# Patient Record
Sex: Female | Born: 1965 | Race: White | Hispanic: No | Marital: Married | Smoking: Current every day smoker
Health system: Southern US, Community
[De-identification: ages and names within clinical notes are randomized; demographics above are authoritative.]

## PROBLEM LIST (undated history)

## (undated) DIAGNOSIS — G894 Chronic pain syndrome: Secondary | ICD-10-CM

## (undated) DIAGNOSIS — M549 Dorsalgia, unspecified: Secondary | ICD-10-CM

## (undated) DIAGNOSIS — K219 Gastro-esophageal reflux disease without esophagitis: Secondary | ICD-10-CM

## (undated) DIAGNOSIS — K589 Irritable bowel syndrome without diarrhea: Secondary | ICD-10-CM

## (undated) DIAGNOSIS — E119 Type 2 diabetes mellitus without complications: Secondary | ICD-10-CM

## (undated) DIAGNOSIS — F419 Anxiety disorder, unspecified: Secondary | ICD-10-CM

## (undated) DIAGNOSIS — F32A Depression, unspecified: Secondary | ICD-10-CM

## (undated) DIAGNOSIS — F329 Major depressive disorder, single episode, unspecified: Secondary | ICD-10-CM

## (undated) DIAGNOSIS — I499 Cardiac arrhythmia, unspecified: Secondary | ICD-10-CM

## (undated) DIAGNOSIS — J449 Chronic obstructive pulmonary disease, unspecified: Secondary | ICD-10-CM

## (undated) DIAGNOSIS — F17200 Nicotine dependence, unspecified, uncomplicated: Secondary | ICD-10-CM

## (undated) DIAGNOSIS — G43909 Migraine, unspecified, not intractable, without status migrainosus: Secondary | ICD-10-CM

## (undated) HISTORY — DX: Dorsalgia, unspecified: M54.9

## (undated) HISTORY — PX: BACK SURGERY: SHX140

## (undated) HISTORY — PX: CHOLECYSTECTOMY: SHX55

## (undated) HISTORY — DX: Gastro-esophageal reflux disease without esophagitis: K21.9

## (undated) HISTORY — DX: Migraine, unspecified, not intractable, without status migrainosus: G43.909

## (undated) HISTORY — DX: Major depressive disorder, single episode, unspecified: F32.9

## (undated) HISTORY — DX: Depression, unspecified: F32.A

---

## 1998-01-06 ENCOUNTER — Emergency Department (HOSPITAL_COMMUNITY): Admission: EM | Admit: 1998-01-06 | Discharge: 1998-01-06 | Payer: Self-pay | Admitting: Emergency Medicine

## 1998-02-11 HISTORY — PX: UTERINE FIBROID SURGERY: SHX826

## 2000-02-12 HISTORY — PX: LUMBAR DISC SURGERY: SHX700

## 2003-11-23 ENCOUNTER — Encounter: Admission: RE | Admit: 2003-11-23 | Discharge: 2003-11-23 | Payer: Self-pay | Admitting: *Deleted

## 2003-12-02 ENCOUNTER — Ambulatory Visit: Payer: Self-pay | Admitting: Family Medicine

## 2004-12-25 ENCOUNTER — Ambulatory Visit: Payer: Self-pay | Admitting: Family Medicine

## 2005-01-14 ENCOUNTER — Ambulatory Visit: Payer: Self-pay | Admitting: Family Medicine

## 2005-07-22 ENCOUNTER — Ambulatory Visit: Payer: Self-pay | Admitting: Family Medicine

## 2005-09-02 ENCOUNTER — Ambulatory Visit: Payer: Self-pay | Admitting: Family Medicine

## 2005-11-20 ENCOUNTER — Ambulatory Visit: Payer: Self-pay | Admitting: Family Medicine

## 2005-12-20 ENCOUNTER — Ambulatory Visit: Payer: Self-pay | Admitting: Urology

## 2005-12-26 ENCOUNTER — Ambulatory Visit: Payer: Self-pay

## 2006-03-05 ENCOUNTER — Ambulatory Visit: Payer: Self-pay | Admitting: Gastroenterology

## 2006-04-11 ENCOUNTER — Ambulatory Visit: Payer: Self-pay | Admitting: Gastroenterology

## 2006-10-23 ENCOUNTER — Ambulatory Visit: Payer: Self-pay

## 2006-12-09 ENCOUNTER — Ambulatory Visit: Payer: Self-pay | Admitting: Pain Medicine

## 2006-12-25 ENCOUNTER — Ambulatory Visit: Payer: Self-pay | Admitting: Family Medicine

## 2006-12-29 ENCOUNTER — Ambulatory Visit: Payer: Self-pay | Admitting: Pain Medicine

## 2007-02-02 ENCOUNTER — Ambulatory Visit: Payer: Self-pay | Admitting: Pain Medicine

## 2007-02-19 ENCOUNTER — Ambulatory Visit: Payer: Self-pay | Admitting: Pain Medicine

## 2007-03-04 ENCOUNTER — Ambulatory Visit: Payer: Self-pay | Admitting: Pain Medicine

## 2007-04-07 ENCOUNTER — Ambulatory Visit: Payer: Self-pay | Admitting: Pain Medicine

## 2007-04-27 ENCOUNTER — Ambulatory Visit: Payer: Self-pay | Admitting: Pain Medicine

## 2007-05-26 ENCOUNTER — Ambulatory Visit: Payer: Self-pay | Admitting: Pain Medicine

## 2007-06-22 ENCOUNTER — Ambulatory Visit: Payer: Self-pay | Admitting: Pain Medicine

## 2007-07-28 ENCOUNTER — Ambulatory Visit: Payer: Self-pay | Admitting: Pain Medicine

## 2007-08-03 ENCOUNTER — Ambulatory Visit: Payer: Self-pay | Admitting: Pain Medicine

## 2007-09-01 ENCOUNTER — Ambulatory Visit: Payer: Self-pay | Admitting: Pain Medicine

## 2007-09-07 ENCOUNTER — Ambulatory Visit: Payer: Self-pay | Admitting: Pain Medicine

## 2007-09-29 ENCOUNTER — Ambulatory Visit: Payer: Self-pay | Admitting: Pain Medicine

## 2007-10-12 ENCOUNTER — Ambulatory Visit: Payer: Self-pay | Admitting: Pain Medicine

## 2007-10-27 ENCOUNTER — Ambulatory Visit: Payer: Self-pay | Admitting: Pain Medicine

## 2007-11-05 IMAGING — MG UNKNOWN MG STUDY
1 series · 4 of 4 positions shown · non-contrast
Comparison: none

REASON FOR EXAM: screening mammo
COMMENTS:

[R CC · right · 4 of 4 slices shown]
[im 1/4]
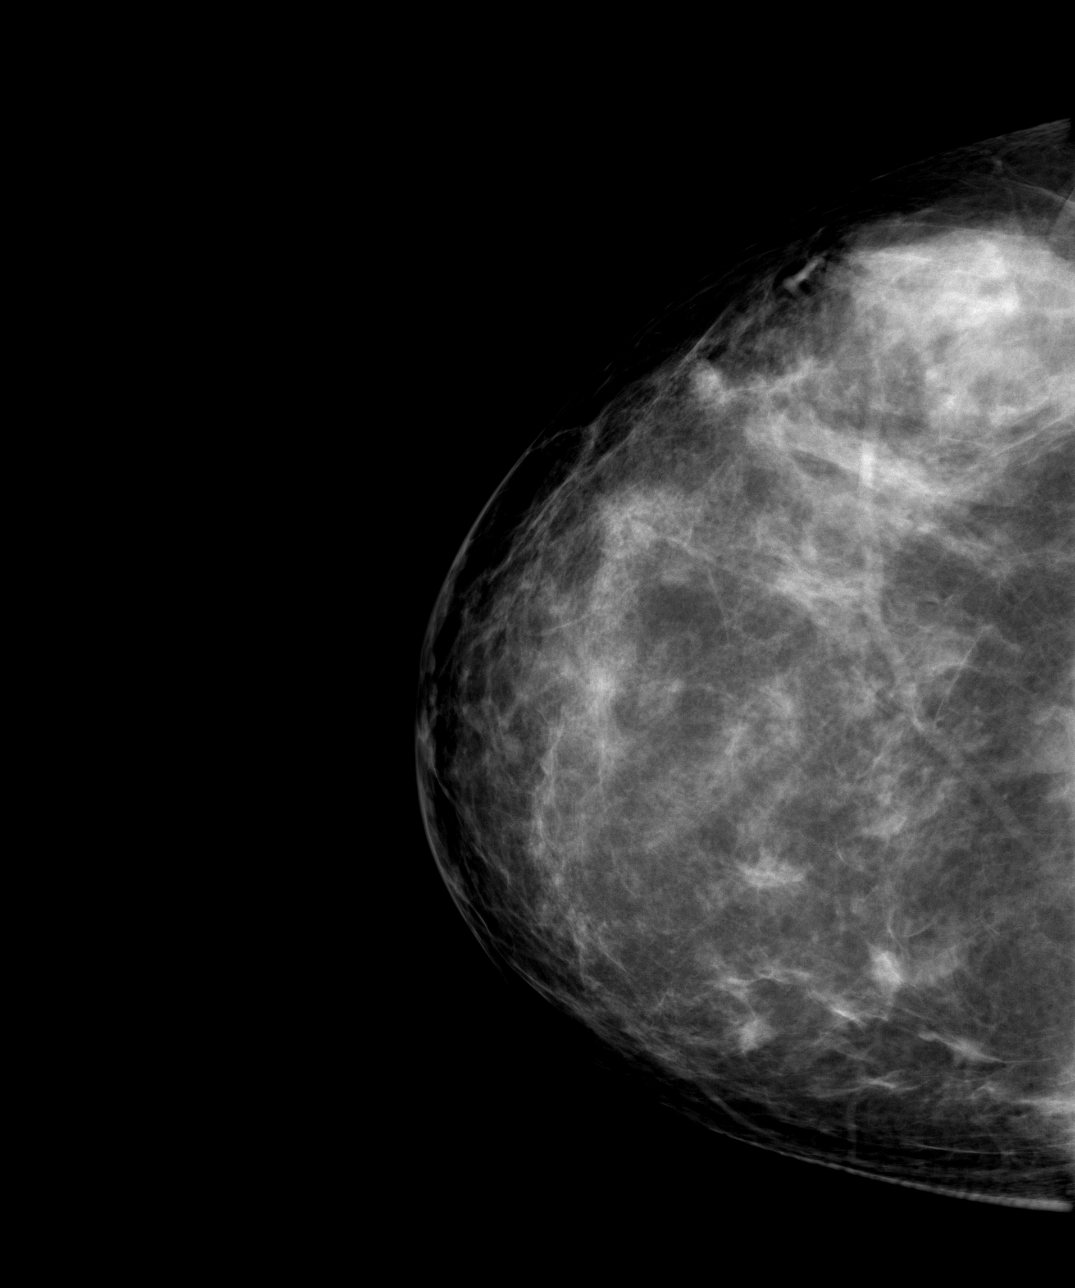
[im 2/4]
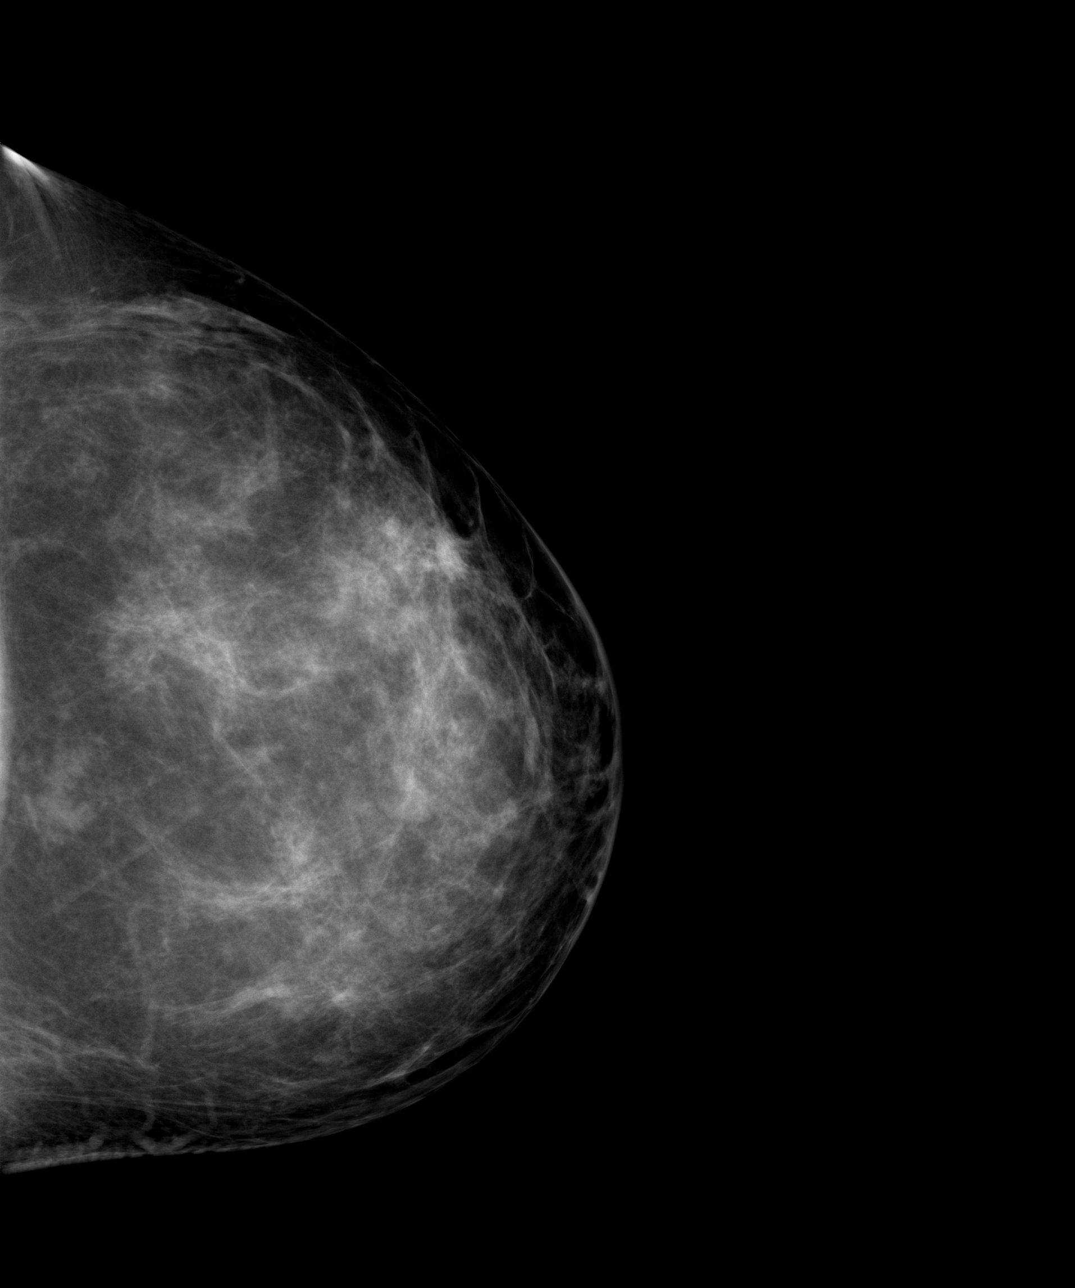
[im 3/4]
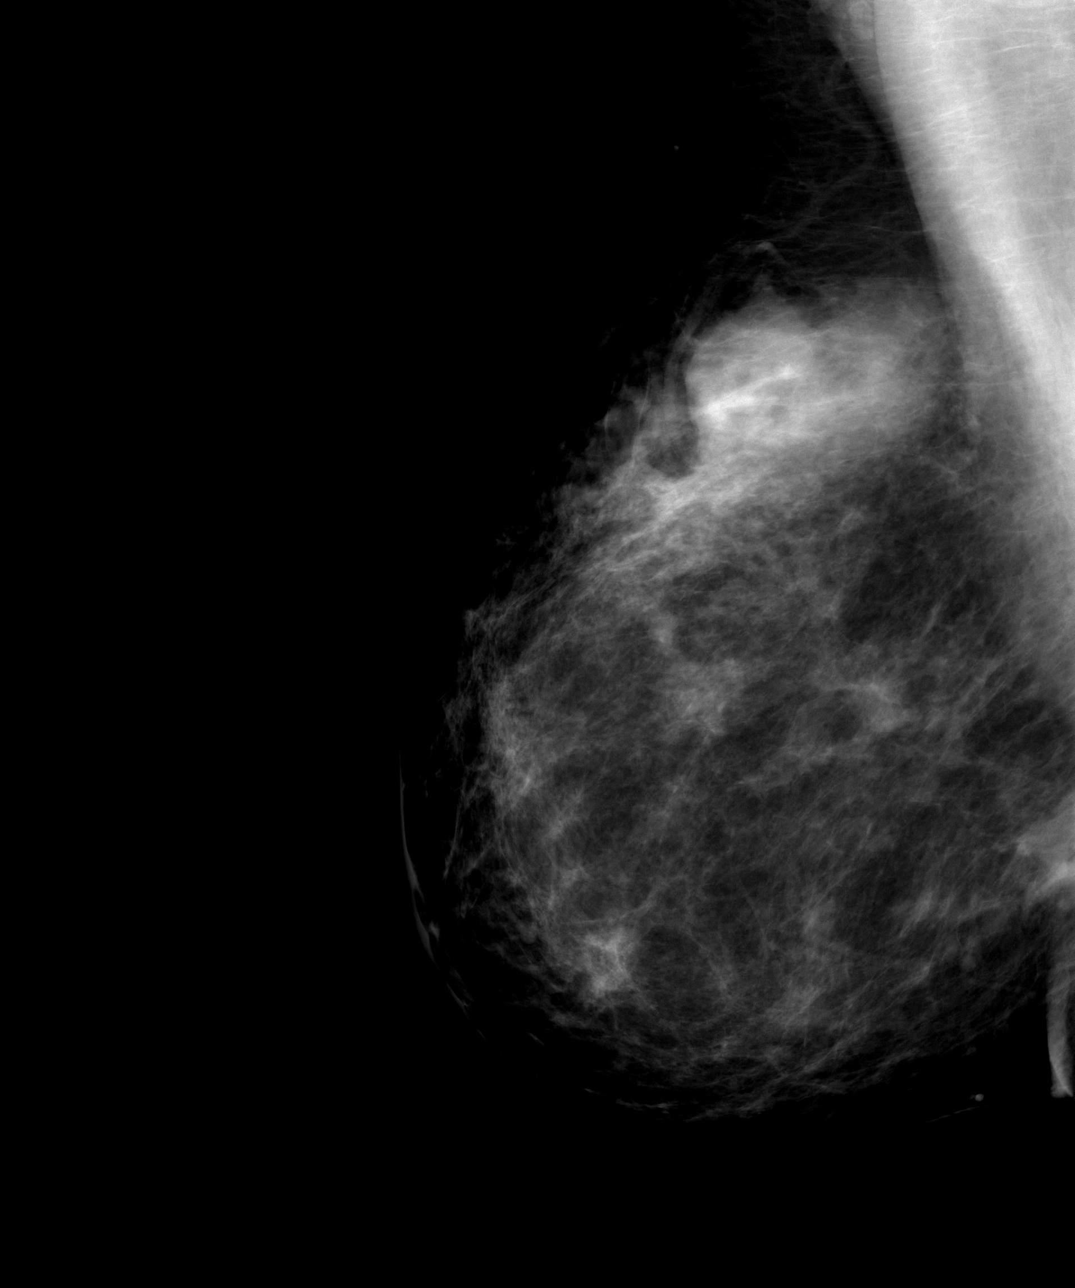
[im 4/4]
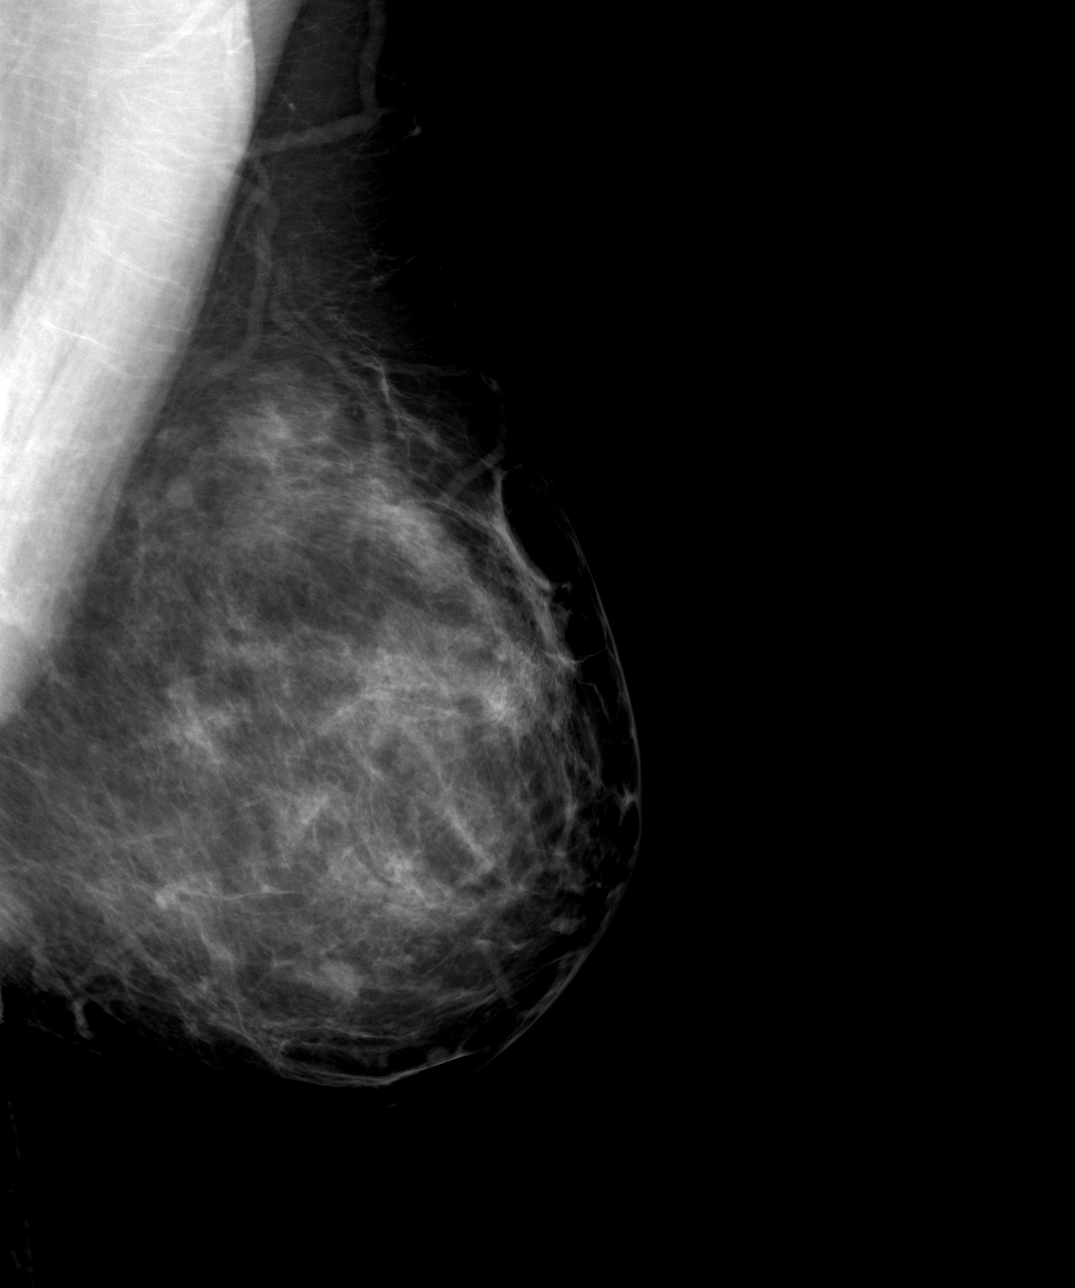

[4 of 4 positions shown; findings below may reference images not displayed]

PROCEDURE:     MAM - MAM DGTL SCREENING MAMMO W/CAD  - December 25, 2004  [DATE]

RESULT:     The patient has no prior exam for comparison.  The breasts
exhibit a moderately dense parenchymal pattern especially in the upper outer
quadrant of the RIGHT breast where there is parenchymal asymmetry.
Additional magnification compression views in this area would be
recommended.  Elsewhere the parenchymal pattern shows some mild nodularity
but no malignant-appearing lesions are evident.
IMPRESSION: Please have the patient return for additional magnification views of the
upper outer quadrant of the RIGHT breast.

BI-RADS: Category 0 - Needs additional imaging evaluation.

A NEGATIVE MAMMOGRAM REPORT DOES NOT PRECLUDE BIOPSY OR OTHER EVALUATION OF
A CLINICALLY PALPABLE OR OTHERWISE SUSPICIOUS MASS OR LESION.  BREAST CANCER
MAY NOT BE DETECTED BY MAMMOGRAPHY IN UP TO 10% OF CASES.

## 2007-11-16 ENCOUNTER — Ambulatory Visit: Payer: Self-pay | Admitting: Pain Medicine

## 2007-11-23 ENCOUNTER — Ambulatory Visit: Payer: Self-pay | Admitting: Pain Medicine

## 2007-11-30 ENCOUNTER — Ambulatory Visit: Payer: Self-pay | Admitting: Pain Medicine

## 2007-12-29 ENCOUNTER — Ambulatory Visit: Payer: Self-pay | Admitting: Pain Medicine

## 2008-01-04 ENCOUNTER — Ambulatory Visit: Payer: Self-pay | Admitting: Pain Medicine

## 2008-01-15 ENCOUNTER — Ambulatory Visit: Payer: Self-pay | Admitting: Unknown Physician Specialty

## 2008-01-26 ENCOUNTER — Ambulatory Visit: Payer: Self-pay | Admitting: Pain Medicine

## 2008-02-08 ENCOUNTER — Ambulatory Visit: Payer: Self-pay | Admitting: Pain Medicine

## 2008-03-03 ENCOUNTER — Ambulatory Visit: Payer: Self-pay | Admitting: Pain Medicine

## 2008-03-07 ENCOUNTER — Ambulatory Visit: Payer: Self-pay | Admitting: Pain Medicine

## 2008-03-17 ENCOUNTER — Ambulatory Visit: Payer: Self-pay | Admitting: Family Medicine

## 2008-03-29 ENCOUNTER — Ambulatory Visit: Payer: Self-pay | Admitting: Pain Medicine

## 2008-04-04 ENCOUNTER — Ambulatory Visit: Payer: Self-pay | Admitting: Unknown Physician Specialty

## 2008-04-04 ENCOUNTER — Ambulatory Visit: Payer: Self-pay | Admitting: Cardiology

## 2008-04-07 ENCOUNTER — Ambulatory Visit: Payer: Self-pay | Admitting: Unknown Physician Specialty

## 2008-04-27 ENCOUNTER — Ambulatory Visit: Payer: Self-pay | Admitting: Pain Medicine

## 2008-05-26 ENCOUNTER — Ambulatory Visit: Payer: Self-pay | Admitting: Pain Medicine

## 2008-06-01 ENCOUNTER — Ambulatory Visit: Payer: Self-pay | Admitting: Pain Medicine

## 2008-06-28 ENCOUNTER — Ambulatory Visit: Payer: Self-pay | Admitting: Pain Medicine

## 2008-07-06 ENCOUNTER — Ambulatory Visit: Payer: Self-pay | Admitting: Pain Medicine

## 2008-08-02 ENCOUNTER — Ambulatory Visit: Payer: Self-pay | Admitting: Pain Medicine

## 2008-08-02 ENCOUNTER — Ambulatory Visit: Payer: Self-pay | Admitting: Unknown Physician Specialty

## 2008-08-30 ENCOUNTER — Ambulatory Visit: Payer: Self-pay | Admitting: Pain Medicine

## 2008-09-29 ENCOUNTER — Ambulatory Visit: Payer: Self-pay | Admitting: Pain Medicine

## 2008-10-31 ENCOUNTER — Ambulatory Visit: Payer: Self-pay | Admitting: Pain Medicine

## 2008-11-29 ENCOUNTER — Ambulatory Visit: Payer: Self-pay | Admitting: Pain Medicine

## 2008-12-16 ENCOUNTER — Ambulatory Visit: Payer: Self-pay | Admitting: Pain Medicine

## 2009-01-17 ENCOUNTER — Ambulatory Visit: Payer: Self-pay | Admitting: Family Medicine

## 2009-01-24 ENCOUNTER — Ambulatory Visit: Payer: Self-pay | Admitting: Pain Medicine

## 2009-02-01 ENCOUNTER — Ambulatory Visit: Payer: Self-pay | Admitting: Pain Medicine

## 2009-02-23 ENCOUNTER — Ambulatory Visit: Payer: Self-pay | Admitting: Pain Medicine

## 2009-03-01 ENCOUNTER — Ambulatory Visit: Payer: Self-pay | Admitting: Pain Medicine

## 2009-03-23 ENCOUNTER — Ambulatory Visit: Payer: Self-pay | Admitting: Pain Medicine

## 2009-03-29 ENCOUNTER — Ambulatory Visit: Payer: Self-pay | Admitting: Pain Medicine

## 2009-05-03 ENCOUNTER — Ambulatory Visit: Payer: Self-pay | Admitting: Pain Medicine

## 2009-05-30 ENCOUNTER — Ambulatory Visit: Payer: Self-pay | Admitting: Pain Medicine

## 2009-06-08 ENCOUNTER — Ambulatory Visit: Payer: Self-pay | Admitting: Gastroenterology

## 2009-06-29 ENCOUNTER — Ambulatory Visit: Payer: Self-pay | Admitting: Pain Medicine

## 2009-07-05 ENCOUNTER — Ambulatory Visit: Payer: Self-pay | Admitting: Pain Medicine

## 2009-08-01 ENCOUNTER — Ambulatory Visit: Payer: Self-pay | Admitting: Pain Medicine

## 2009-08-16 ENCOUNTER — Ambulatory Visit: Payer: Self-pay | Admitting: Pain Medicine

## 2009-08-29 ENCOUNTER — Ambulatory Visit: Payer: Self-pay | Admitting: Pain Medicine

## 2009-10-03 ENCOUNTER — Ambulatory Visit: Payer: Self-pay | Admitting: Pain Medicine

## 2009-10-23 ENCOUNTER — Ambulatory Visit: Payer: Self-pay | Admitting: Pain Medicine

## 2009-11-23 ENCOUNTER — Ambulatory Visit: Payer: Self-pay | Admitting: Pain Medicine

## 2009-11-27 ENCOUNTER — Ambulatory Visit: Payer: Self-pay | Admitting: Pain Medicine

## 2009-12-28 ENCOUNTER — Ambulatory Visit: Payer: Self-pay | Admitting: Pain Medicine

## 2010-01-25 ENCOUNTER — Ambulatory Visit: Payer: Self-pay | Admitting: Pain Medicine

## 2010-02-22 ENCOUNTER — Ambulatory Visit: Payer: Self-pay | Admitting: Pain Medicine

## 2010-03-09 ENCOUNTER — Ambulatory Visit: Payer: Self-pay | Admitting: Pain Medicine

## 2010-03-12 ENCOUNTER — Ambulatory Visit: Payer: Self-pay | Admitting: Pain Medicine

## 2010-03-12 ENCOUNTER — Emergency Department: Payer: Self-pay | Admitting: Emergency Medicine

## 2010-03-20 ENCOUNTER — Ambulatory Visit: Payer: Self-pay | Admitting: Family Medicine

## 2010-05-15 ENCOUNTER — Ambulatory Visit: Payer: Self-pay | Admitting: Pain Medicine

## 2010-05-28 ENCOUNTER — Ambulatory Visit: Payer: Self-pay | Admitting: Pain Medicine

## 2010-06-12 ENCOUNTER — Ambulatory Visit: Payer: Self-pay | Admitting: Pain Medicine

## 2010-06-20 ENCOUNTER — Ambulatory Visit: Payer: Self-pay | Admitting: Pain Medicine

## 2010-07-10 ENCOUNTER — Ambulatory Visit: Payer: Self-pay | Admitting: Pain Medicine

## 2010-08-09 ENCOUNTER — Ambulatory Visit: Payer: Self-pay | Admitting: Pain Medicine

## 2010-09-27 ENCOUNTER — Ambulatory Visit: Payer: Self-pay | Admitting: Pain Medicine

## 2010-10-25 ENCOUNTER — Ambulatory Visit: Payer: Self-pay | Admitting: Pain Medicine

## 2010-11-07 ENCOUNTER — Ambulatory Visit: Payer: Self-pay | Admitting: Pain Medicine

## 2010-11-20 ENCOUNTER — Ambulatory Visit: Payer: Self-pay | Admitting: Pain Medicine

## 2010-12-05 ENCOUNTER — Ambulatory Visit: Payer: Self-pay | Admitting: Pain Medicine

## 2010-12-20 ENCOUNTER — Ambulatory Visit: Payer: Self-pay | Admitting: Pain Medicine

## 2011-01-07 ENCOUNTER — Ambulatory Visit: Payer: Self-pay | Admitting: Pain Medicine

## 2011-01-22 ENCOUNTER — Ambulatory Visit: Payer: Self-pay | Admitting: Pain Medicine

## 2011-02-21 ENCOUNTER — Ambulatory Visit: Payer: Self-pay | Admitting: Pain Medicine

## 2011-03-28 ENCOUNTER — Ambulatory Visit: Payer: Self-pay | Admitting: Pain Medicine

## 2011-04-29 ENCOUNTER — Ambulatory Visit: Payer: Self-pay | Admitting: Pain Medicine

## 2011-05-23 ENCOUNTER — Ambulatory Visit: Payer: Self-pay | Admitting: Family Medicine

## 2011-05-28 ENCOUNTER — Ambulatory Visit: Payer: Self-pay | Admitting: Pain Medicine

## 2011-06-25 ENCOUNTER — Ambulatory Visit: Payer: Self-pay | Admitting: Pain Medicine

## 2011-07-10 ENCOUNTER — Ambulatory Visit: Payer: Self-pay | Admitting: Pain Medicine

## 2011-07-25 ENCOUNTER — Ambulatory Visit: Payer: Self-pay | Admitting: Pain Medicine

## 2011-08-27 ENCOUNTER — Ambulatory Visit: Payer: Self-pay | Admitting: Pain Medicine

## 2011-09-24 ENCOUNTER — Ambulatory Visit: Payer: Self-pay | Admitting: Pain Medicine

## 2011-09-30 ENCOUNTER — Ambulatory Visit: Payer: Self-pay | Admitting: Pain Medicine

## 2011-10-24 ENCOUNTER — Ambulatory Visit: Payer: Self-pay | Admitting: Pain Medicine

## 2011-11-26 ENCOUNTER — Ambulatory Visit: Payer: Self-pay | Admitting: Pain Medicine

## 2011-12-31 ENCOUNTER — Ambulatory Visit: Payer: Self-pay | Admitting: Pain Medicine

## 2012-01-07 ENCOUNTER — Ambulatory Visit: Payer: Self-pay | Admitting: Obstetrics and Gynecology

## 2012-01-07 LAB — CBC
HCT: 39.1 % (ref 35.0–47.0)
HGB: 13.5 g/dL (ref 12.0–16.0)
MCH: 31.6 pg (ref 26.0–34.0)
RBC: 4.26 10*6/uL (ref 3.80–5.20)
WBC: 7.3 10*3/uL (ref 3.6–11.0)

## 2012-01-07 LAB — COMPREHENSIVE METABOLIC PANEL
Albumin: 3.6 g/dL (ref 3.4–5.0)
BUN: 5 mg/dL — ABNORMAL LOW (ref 7–18)
Creatinine: 0.88 mg/dL (ref 0.60–1.30)
EGFR (African American): >60
EGFR (Non-African Amer.): >60
SGOT(AST): 22 U/L (ref 15–37)
SGPT (ALT): 27 U/L (ref 12–78)
Sodium: 139 mmol/L (ref 136–145)
Total Protein: 7.3 g/dL (ref 6.4–8.2)

## 2012-01-16 ENCOUNTER — Ambulatory Visit: Payer: Self-pay | Admitting: Obstetrics and Gynecology

## 2012-01-20 LAB — PATHOLOGY REPORT

## 2012-01-30 ENCOUNTER — Ambulatory Visit: Payer: Self-pay | Admitting: Pain Medicine

## 2012-02-27 ENCOUNTER — Ambulatory Visit: Payer: Self-pay | Admitting: Pain Medicine

## 2012-03-25 ENCOUNTER — Ambulatory Visit: Payer: Self-pay | Admitting: Pain Medicine

## 2012-04-23 ENCOUNTER — Ambulatory Visit: Payer: Self-pay | Admitting: Pain Medicine

## 2012-05-25 ENCOUNTER — Ambulatory Visit: Payer: Self-pay | Admitting: Family Medicine

## 2012-05-28 ENCOUNTER — Ambulatory Visit: Payer: Self-pay | Admitting: Pain Medicine

## 2012-06-06 ENCOUNTER — Ambulatory Visit: Payer: Self-pay | Admitting: Pain Medicine

## 2012-06-29 ENCOUNTER — Ambulatory Visit: Payer: Self-pay | Admitting: Pain Medicine

## 2012-07-10 ENCOUNTER — Ambulatory Visit: Payer: Self-pay | Admitting: Pain Medicine

## 2012-07-28 ENCOUNTER — Ambulatory Visit: Payer: Self-pay | Admitting: Pain Medicine

## 2012-08-26 ENCOUNTER — Ambulatory Visit: Payer: Self-pay | Admitting: Pain Medicine

## 2012-09-24 ENCOUNTER — Ambulatory Visit: Payer: Self-pay | Admitting: Pain Medicine

## 2012-10-22 ENCOUNTER — Ambulatory Visit: Payer: Self-pay | Admitting: Pain Medicine

## 2012-11-18 ENCOUNTER — Ambulatory Visit: Payer: Self-pay | Admitting: Pain Medicine

## 2012-12-23 ENCOUNTER — Ambulatory Visit: Payer: Self-pay | Admitting: Pain Medicine

## 2013-01-21 ENCOUNTER — Ambulatory Visit: Payer: Self-pay | Admitting: Pain Medicine

## 2013-02-17 ENCOUNTER — Ambulatory Visit: Payer: Self-pay | Admitting: Pain Medicine

## 2013-03-15 ENCOUNTER — Ambulatory Visit: Payer: Self-pay | Admitting: Pain Medicine

## 2013-04-22 ENCOUNTER — Ambulatory Visit: Payer: Self-pay | Admitting: Pain Medicine

## 2013-04-25 ENCOUNTER — Emergency Department: Payer: Self-pay | Admitting: Emergency Medicine

## 2013-04-25 LAB — BASIC METABOLIC PANEL
Anion Gap: 5 — ABNORMAL LOW (ref 7–16)
BUN: 4 mg/dL — ABNORMAL LOW (ref 7–18)
Calcium, Total: 8.5 mg/dL (ref 8.5–10.1)
Chloride: 111 mmol/L — ABNORMAL HIGH (ref 98–107)
Co2: 26 mmol/L (ref 21–32)
Creatinine: 0.91 mg/dL (ref 0.60–1.30)
EGFR (African American): >60
EGFR (Non-African Amer.): >60
Glucose: 106 mg/dL — ABNORMAL HIGH (ref 65–99)
OSMOLALITY: 280 (ref 275–301)
Potassium: 3.9 mmol/L (ref 3.5–5.1)
Sodium: 142 mmol/L (ref 136–145)

## 2013-04-25 LAB — CBC
HCT: 42 % (ref 35.0–47.0)
HGB: 14 g/dL (ref 12.0–16.0)
MCH: 31 pg (ref 26.0–34.0)
MCHC: 33.3 g/dL (ref 32.0–36.0)
MCV: 93 fL (ref 80–100)
Platelet: 338 10*3/uL (ref 150–440)
RBC: 4.52 10*6/uL (ref 3.80–5.20)
RDW: 14 % (ref 11.5–14.5)
WBC: 12.8 10*3/uL — AB (ref 3.6–11.0)

## 2013-04-25 LAB — TROPONIN I: Troponin-I: <0.02 ng/mL

## 2013-05-20 ENCOUNTER — Ambulatory Visit: Payer: Self-pay | Admitting: Pain Medicine

## 2013-06-17 ENCOUNTER — Ambulatory Visit: Payer: Self-pay | Admitting: Pain Medicine

## 2013-07-28 ENCOUNTER — Ambulatory Visit: Payer: Self-pay | Admitting: Pain Medicine

## 2013-08-24 ENCOUNTER — Ambulatory Visit: Payer: Self-pay | Admitting: Pain Medicine

## 2013-10-20 ENCOUNTER — Ambulatory Visit: Payer: Self-pay | Admitting: Pain Medicine

## 2013-11-18 ENCOUNTER — Ambulatory Visit: Payer: Self-pay | Admitting: Pain Medicine

## 2013-12-16 ENCOUNTER — Ambulatory Visit: Payer: Self-pay | Admitting: Pain Medicine

## 2013-12-20 ENCOUNTER — Ambulatory Visit: Payer: Self-pay | Admitting: Pain Medicine

## 2013-12-28 ENCOUNTER — Ambulatory Visit: Payer: Self-pay | Admitting: Internal Medicine

## 2014-01-18 ENCOUNTER — Ambulatory Visit: Payer: Self-pay | Admitting: Pain Medicine

## 2014-02-17 ENCOUNTER — Ambulatory Visit: Payer: Self-pay | Admitting: Pain Medicine

## 2014-03-21 ENCOUNTER — Ambulatory Visit: Payer: Self-pay | Admitting: Pain Medicine

## 2014-04-18 ENCOUNTER — Ambulatory Visit: Payer: Self-pay | Admitting: Pain Medicine

## 2014-05-18 ENCOUNTER — Ambulatory Visit
Admit: 2014-05-18 | Disposition: A | Discharge status: Home / Self Care | Payer: Self-pay | Attending: Pain Medicine | Admitting: Pain Medicine

## 2014-06-03 NOTE — Op Note (Signed)
PATIENT NAME:  Meagan Bridges, Meagan Bridges MR#:  [id number] DATE OF BIRTH:  1965/07/10  DATE OF PROCEDURE:  01/16/2012  PREOPERATIVE DIAGNOSIS:  Abnormal uterine bleeding.   POSTOPERATIVE DIAGNOSIS: Abnormal uterine bleeding.   PROCEDURES:    1. Dilatation and curettage.  2. Hysteroscopy.  3. NovaSure endometrial ablation.   SURGEON:  Prentice Docker, MD.  ANESTHESIA:  General.   ESTIMATED BLOOD LOSS: Minimal.   COMPLICATIONS: None.   IV FLUIDS: 500 milliliters crystalloid.   FINDINGS:  1. Normal appearing uterine cavity.  2. Appearance of ablation globally in uterus post procedure.  SPECIMENS: Endometrial curettings.   CONDITION AT THE END OF THE PROCEDURE:  Stable.   PROCEDURE IN DETAIL:  The patient was met in the preoperative area and her questions were answered. She was taken to the Operating Room and placed under general anesthesia. Anesthesia was found to be adequate and she was then placed in high lithotomy using the candy-cane stirrups and prepped and draped in the usual sterile fashion. After a time-out was called, a straight catheter was used to empty her bladder.   A sterile speculum was placed in the vagina and a single tooth tenaculum was used to grasp the anterior lip of the cervix. The uterus was sounded to 8 cm. The cervix was then serially dilated using Hegar's dilators to 8 millimeters.   The hysteroscope was then gently introduced through the cervix with the above-noted findings. The hysteroscope was then removed and the curettage was then undertaken.    The endometrial ablation portion of the surgery was undertaken with the cavity length being 4 cm and the cavity width being 3.8 cm. Cavity test was completed and found to be appropriate and the ablation commenced without difficulty. At the end of the procedure the NovaSure device was removed and the hysteroscope was reintroduced through the cervix with globally ablated endometrial lining noted. The hysteroscope was then  removed and the tenaculum was removed from the anterior lip of the cervix with good hemostasis noted. The speculum was then removed.   The patient tolerated the procedure well. Sponge, lap and needle counts were correct x2. The patient was awakened in the Operating Room and taken to the Recovery Room in stable condition.    ____________________________ Will Bonnet, MD sdj:ap D: 01/16/2012 15:58:46 ET T: 01/16/2012 17:37:29 ET JOB#: [id number]  cc: Will Bonnet, MD, <Dictator> Will Bonnet MD ELECTRONICALLY SIGNED 02/26/2012 7:06

## 2014-06-20 ENCOUNTER — Ambulatory Visit: Payer: Self-pay | Admitting: Pain Medicine

## 2014-06-22 ENCOUNTER — Ambulatory Visit: Payer: Self-pay | Admitting: Pain Medicine

## 2014-06-22 ENCOUNTER — Other Ambulatory Visit: Payer: Self-pay | Admitting: Pain Medicine

## 2014-06-22 ENCOUNTER — Encounter: Payer: Self-pay | Admitting: Pain Medicine

## 2014-06-22 ENCOUNTER — Ambulatory Visit: Payer: BLUE CROSS/BLUE SHIELD | Attending: Pain Medicine | Admitting: Pain Medicine

## 2014-06-22 VITALS — BP 119/62 | HR 85 | Temp 97.5°F | Resp 16 | Ht 63.0 in | Wt 170.0 lb

## 2014-06-22 DIAGNOSIS — M461 Sacroiliitis, not elsewhere classified: Secondary | ICD-10-CM

## 2014-06-22 DIAGNOSIS — M5136 Other intervertebral disc degeneration, lumbar region: Secondary | ICD-10-CM

## 2014-06-22 DIAGNOSIS — M419 Scoliosis, unspecified: Secondary | ICD-10-CM | POA: Diagnosis not present

## 2014-06-22 DIAGNOSIS — M47816 Spondylosis without myelopathy or radiculopathy, lumbar region: Secondary | ICD-10-CM

## 2014-06-22 DIAGNOSIS — M5416 Radiculopathy, lumbar region: Secondary | ICD-10-CM

## 2014-06-22 DIAGNOSIS — M4806 Spinal stenosis, lumbar region: Secondary | ICD-10-CM | POA: Insufficient documentation

## 2014-06-22 DIAGNOSIS — M8580 Other specified disorders of bone density and structure, unspecified site: Secondary | ICD-10-CM | POA: Insufficient documentation

## 2014-06-22 DIAGNOSIS — M5126 Other intervertebral disc displacement, lumbar region: Secondary | ICD-10-CM | POA: Diagnosis not present

## 2014-06-22 DIAGNOSIS — M545 Low back pain: Secondary | ICD-10-CM | POA: Diagnosis present

## 2014-06-22 DIAGNOSIS — M51369 Other intervertebral disc degeneration, lumbar region without mention of lumbar back pain or lower extremity pain: Secondary | ICD-10-CM

## 2014-06-22 DIAGNOSIS — M47818 Spondylosis without myelopathy or radiculopathy, sacral and sacrococcygeal region: Secondary | ICD-10-CM

## 2014-06-22 MED ORDER — FENTANYL CITRATE (PF) 100 MCG/2ML IJ SOLN
INTRAMUSCULAR | Status: AC
  Administered 2014-06-22: 100 ug via INTRAVENOUS
  Filled 2014-06-22: qty 2

## 2014-06-22 MED ORDER — TRIAMCINOLONE ACETONIDE 40 MG/ML IJ SUSP
INTRAMUSCULAR | Status: AC
  Administered 2014-06-22: 40 mg
  Filled 2014-06-22: qty 1

## 2014-06-22 MED ORDER — HYDROCODONE-ACETAMINOPHEN 5-325 MG PO TABS: ORAL_TABLET | ORAL | Status: DC

## 2014-06-22 MED ORDER — MIDAZOLAM HCL 5 MG/5ML IJ SOLN
INTRAMUSCULAR | Status: AC
  Administered 2014-06-22: 3 mg via INTRAVENOUS
  Filled 2014-06-22: qty 5

## 2014-06-22 MED ORDER — CYCLOBENZAPRINE HCL 10 MG PO TABS: 10.0000 mg | ORAL_TABLET | Freq: Three times a day (TID) | ORAL | Status: DC | PRN

## 2014-06-22 MED ORDER — ORPHENADRINE CITRATE 30 MG/ML IJ SOLN
INTRAMUSCULAR | Status: AC
  Administered 2014-06-22: 30 mg
  Filled 2014-06-22: qty 2

## 2014-06-22 MED ORDER — BUPIVACAINE HCL (PF) 0.25 % IJ SOLN
INTRAMUSCULAR | Status: AC
  Administered 2014-06-22: 20 mL
  Filled 2014-06-22: qty 30

## 2014-06-22 NOTE — Progress Notes (Signed)
Discharged via w/c at 1215 am. Tolerating po fluids well. Instructions (verbal and written) given to patient. Teachback x3.

## 2014-06-22 NOTE — Patient Instructions (Addendum)
Continue present medications and antibioticcs (Script for [employer] given today and Flex[facility]il refill sent [facility].)  F/U PCP for evaliation of  BP and general medical  Condition.  F/U surgical evaluation.  F/U nrurological evaluation.  May consider radiofrequency rhizolysis or intraspinal procedures pending response to present treatment and F/U evaluation.  Patient to call Pain Management Center should patient have concerns prior to scheduled return appointment.    Pain Management Discharge Instructions  General Discharge Instructions :  If you need to reach your doctor call: Monday-Friday 8:00 am - 4:00 pm at [phone] or toll free [phone].  After clinic hours [phone] to have operator reach doctor.  Bring all of your medication bottles to all your appointments in the pain clinic.  To cancel or reschedule your appointment with Pain Management please remember to call 24 hours in advance to avoid a fee.  Refer to the educational materials which you have been given on: General Risks, I had my Procedure. Discharge Instructions, Post Sedation.  Post Procedure Instructions:  The drugs you were given will stay in your system until tomorrow, so for the next 24 hours you should not drive, make any legal decisions or drink any alcoholic beverages.  You may eat anything you prefer, but it is better to start with liquids then soups and crackers, and gradually work up to solid foods.  Please notify your doctor immediately if you have any unusual bleeding, trouble breathing or pain that is not related to your normal pain.  Depending on the type of procedure that was done, some parts of your body may feel week and/or numb.  This usually clears up by tonight or the next day.  Walk with the use of an assistive device or accompanied by an adult for the 24 hours.  You may use ice on the affected area for the first 24 hours.  Put ice in a Ziploc bag and cover with a towel and place  against area 15 minutes on 15 minutes off.  You may switch to heat after 24 hours.

## 2014-06-22 NOTE — Progress Notes (Signed)
Patient is 49 year old female returns to pain management Center for further evaluation and treatment of pain involving the lumbar lower extremity region with severe pain rated best standing walking and twisting and turning patient was severe tenderness to palpation over the PSIS and PIIS regions with positive Patrick's maneuver is well. There is concern regarding intraspinal abnormalities contributing to patient's symptomatology as well as significant sacroiliac joint dysfunction. Prior MRIs revealed patient to be with L2-3 and L3-4 disc bulge, L3-4 facet arthropathy, severe spinal canal stenosis, L4-5 disc bulging and multilevel facet arthropathy arthropathy, osteopenia and scoliosis. We will proceed with sacroiliac joint injection in attempt to decrease severity of patient's symptoms minimize aggression of patient's symptoms and hopefully avoid need for more involved treatment. The patient was understanding and in agreement with suggested treatment plan.  Description of procedure sacroiliac joint injection on the left side and right side  Left sacroiliac joint injection  Patient was taken to fluoroscopy suite with EKG blood pressure pulse and pulse oximetry monitoring in place. Patient in prone position. Patient was administered IV Versed and IV fentanyl for moderate conscious sedation. Betadine prep of proposed entry site was accomplished under fluoroscopic guidance a 22-gauge needle was inserted at the inferior pole of the sacroiliac joint on the left. Following documentation of needle placement at the inferior pole of the sacroiliac joint on the left, a total of 1 cc of quarter percent bupivacaine with Kenalog was injected for left inferior pole sacroiliac joint injection. Needle placement was then accomplished at the left superior pole  The patient in prone position and Betadine prep of proposed entry site accomplished a 22-gauge needle was inserted at the superior pole of the sacroiliac joint on the  left under fluoroscopic guidance. Following documentation of needle placement at the superior pole of the sacroiliac joint on the left, a total of 1 cc of quarter percent bupivacaine with Kenalog was injected for right superior pole sacroiliac joint injection. Needle was removed  Right sacroiliac joint injection  Repeat the technique on the right is performed on the left with injection of the inferior and superior poles of the sacroiliac joint on the right. A total of 10 mg of Kenalog was utilized for the procedure    Plan   Continue present medications.  F/U PCP for evaliation of  BP and general medical  condition.  F/U surgical evaluation.  F/U nrurological evaluation.  May consider radiofrequency rhizolysis or intraspinal procedures pending response to present treatment and F/U evaluation.  Patient to call Pain Management Center should patient have concerns prior to scheduled return appointment.

## 2014-06-23 ENCOUNTER — Telehealth: Payer: Self-pay | Admitting: *Deleted

## 2014-06-27 NOTE — Telephone Encounter (Signed)
No problems post procedure. 

## 2014-07-25 ENCOUNTER — Ambulatory Visit: Payer: BC Managed Care – PPO | Attending: Pain Medicine | Admitting: Pain Medicine

## 2014-07-25 DIAGNOSIS — M545 Low back pain: Secondary | ICD-10-CM | POA: Diagnosis present

## 2014-07-25 DIAGNOSIS — M533 Sacrococcygeal disorders, not elsewhere classified: Secondary | ICD-10-CM | POA: Diagnosis not present

## 2014-07-25 DIAGNOSIS — M5481 Occipital neuralgia: Secondary | ICD-10-CM | POA: Insufficient documentation

## 2014-07-25 DIAGNOSIS — M5126 Other intervertebral disc displacement, lumbar region: Secondary | ICD-10-CM | POA: Diagnosis not present

## 2014-07-25 DIAGNOSIS — M5136 Other intervertebral disc degeneration, lumbar region: Secondary | ICD-10-CM

## 2014-07-25 DIAGNOSIS — M47816 Spondylosis without myelopathy or radiculopathy, lumbar region: Secondary | ICD-10-CM

## 2014-07-25 DIAGNOSIS — M47896 Other spondylosis, lumbar region: Secondary | ICD-10-CM | POA: Insufficient documentation

## 2014-07-25 DIAGNOSIS — M47818 Spondylosis without myelopathy or radiculopathy, sacral and sacrococcygeal region: Secondary | ICD-10-CM

## 2014-07-25 DIAGNOSIS — M79604 Pain in right leg: Secondary | ICD-10-CM | POA: Diagnosis present

## 2014-07-25 DIAGNOSIS — M5416 Radiculopathy, lumbar region: Secondary | ICD-10-CM

## 2014-07-25 DIAGNOSIS — M79605 Pain in left leg: Secondary | ICD-10-CM | POA: Diagnosis present

## 2014-07-25 DIAGNOSIS — M461 Sacroiliitis, not elsewhere classified: Secondary | ICD-10-CM

## 2014-07-25 MED ORDER — HYDROCODONE-ACETAMINOPHEN 5-325 MG PO TABS: ORAL_TABLET | ORAL | Status: DC

## 2014-07-25 MED ORDER — CYCLOBENZAPRINE HCL 10 MG PO TABS: ORAL_TABLET | ORAL | Status: DC

## 2014-07-25 NOTE — Progress Notes (Signed)
   Subjective:    Patient ID: Meagan Bridges, female    DOB: [birth year], 49 y.o.   MRN: [id number]  HPI  The  patient is 49 year old female who returns to [facility] for further evaluation and treatment of pain involving the lower back and lower extremity region. The patient states that her pain is well controlled at this time. Patient denies any recent trauma or change in events of daily living the call significant change in symptomatology. Patient also states that headaches appear to be well controlled at the present time. We will continue patient's Flexeril and hydrocodone acetaminophen and will remain available to consider modifications of treatment should there be significant change in patient's condition The  patient was understanding and agreement with suggested treatment plan     Review of Systems     Objective:   Physical Exam   There was mild tends to palpation of the splenius capitis and occipitalis musculature regions. Palpation of the cervical and  Thoracic paraspinal musculature regions reproduced mild discomfort. There was unremarkable Spurling's maneuver. Palpation of the acromioclavicular glenohumeral joint regions reproduced mild discomfort. Tinel and Phalen's maneuver was associated with mild discomfort. Patient appeared to be with bilaterally equal grip strength. Palpation over the thoracic facet thoracic paraspinal musculature region reproduced pain of moderate degree. No crepitus of the thoracic region was noted. Palpation over the lumbar paraspinal musculature region lumbar facet region was associated with mild discomfort. Lateral bending and rotation reproduce mild discomfort. Straight leg raising was tolerates approximately 30 and there was no increased pain with dorsiflexion noted. Mild tenderness of the greater trochanteric region and iliotibial band region was noted. Abdomen was nontender. No costovertebral tenderness was noted.      Assessment & Plan:     degenerative changes lumbar spine  L4-L5 disc protrusion, with narrowing of the left neural foramen at this level, L4-5 annular disc bulging and left paracentral disc protrusion.   Lumbar facet syndrome   Lumbar radiculopathy   sacroiliac joint dysfunction   Greater occipital neuralgia    plan   Continue present medications. Flexeril and hydrocodone acetaminophen  F/U PCP for evaliation of  BP and general medical  condition.  F/U surgical evaluation.  F/U neurological evaluation.  May consider radiofrequency rhizolysis or intraspinal procedures pending response to present treatment and F/U evaluation.  Patient to call Pain Management Center should patient have concerns prior to scheduled return appointment.

## 2014-07-25 NOTE — Progress Notes (Signed)
Safety precautions to be maintained throughout the outpatient stay will include: orient to surroundings, keep bed in low position, maintain call bell within reach at all times, provide assistance with transfer out of bed and ambulation.  Discharged ambulatory at [facility] back 3 done regarding meds.

## 2014-07-25 NOTE — Patient Instructions (Addendum)
Continue present medications.  F/U PCP for evaliation of  BP and general medical  condition.  F/U surgical evaluation.  F/U neurological evaluation.  May consider radiofrequency rhizolysis or intraspinal procedures pending response to present treatment and F/U evaluation.  Patient to call Pain Management Center should patient have concerns prior to scheduled return appointment.   Prescription for Hydrocodone given [facility]

## 2014-08-22 ENCOUNTER — Ambulatory Visit: Payer: BC Managed Care – PPO | Attending: Pain Medicine | Admitting: Pain Medicine

## 2014-08-22 VITALS — BP 133/74 | HR 98 | Temp 97.6°F | Resp 16 | Ht 63.0 in | Wt 170.0 lb

## 2014-08-22 DIAGNOSIS — M47818 Spondylosis without myelopathy or radiculopathy, sacral and sacrococcygeal region: Secondary | ICD-10-CM

## 2014-08-22 DIAGNOSIS — M5136 Other intervertebral disc degeneration, lumbar region: Secondary | ICD-10-CM

## 2014-08-22 DIAGNOSIS — M5126 Other intervertebral disc displacement, lumbar region: Secondary | ICD-10-CM | POA: Insufficient documentation

## 2014-08-22 DIAGNOSIS — M419 Scoliosis, unspecified: Secondary | ICD-10-CM | POA: Diagnosis not present

## 2014-08-22 DIAGNOSIS — M8580 Other specified disorders of bone density and structure, unspecified site: Secondary | ICD-10-CM | POA: Insufficient documentation

## 2014-08-22 DIAGNOSIS — M5481 Occipital neuralgia: Secondary | ICD-10-CM | POA: Diagnosis not present

## 2014-08-22 DIAGNOSIS — M461 Sacroiliitis, not elsewhere classified: Secondary | ICD-10-CM

## 2014-08-22 DIAGNOSIS — M47816 Spondylosis without myelopathy or radiculopathy, lumbar region: Secondary | ICD-10-CM

## 2014-08-22 DIAGNOSIS — M533 Sacrococcygeal disorders, not elsewhere classified: Secondary | ICD-10-CM | POA: Insufficient documentation

## 2014-08-22 DIAGNOSIS — R51 Headache: Secondary | ICD-10-CM | POA: Diagnosis present

## 2014-08-22 DIAGNOSIS — M4806 Spinal stenosis, lumbar region: Secondary | ICD-10-CM | POA: Diagnosis not present

## 2014-08-22 DIAGNOSIS — M5416 Radiculopathy, lumbar region: Secondary | ICD-10-CM | POA: Diagnosis not present

## 2014-08-22 DIAGNOSIS — M542 Cervicalgia: Secondary | ICD-10-CM | POA: Diagnosis present

## 2014-08-22 DIAGNOSIS — M51369 Other intervertebral disc degeneration, lumbar region without mention of lumbar back pain or lower extremity pain: Secondary | ICD-10-CM

## 2014-08-22 MED ORDER — HYDROCODONE-ACETAMINOPHEN 5-325 MG PO TABS: ORAL_TABLET | ORAL | Status: DC

## 2014-08-22 NOTE — Patient Instructions (Signed)
Continue present medications hydrocodone acetaminophen and Flexeril  F/U PCP for evaliation of  BP and general medical  condition.  F/U surgical evaluation  F/U neurological evaluation  F/U psych evaluation with Dr.Reddy as discussed  May consider radiofrequency rhizolysis or intraspinal procedures pending response to present treatment and F/U evaluation.  Patient to call Pain Management Center should patient have concerns prior to scheduled return appointment.

## 2014-08-22 NOTE — Progress Notes (Signed)
   Subjective:    Patient ID: Meagan Bridges, female    DOB: 03/11/1965, 49 y.o.   MRN: [id number]  HPI  Patient 49 year old female returns to [employer] for follow-up evaluation and treatment of pain consisting of headaches with pain radiating from the neck to the back of the head patient also with lower back lower extremity pain and pain aggravated by standing walking patient also has difficulty turning over in bed at night. Patient denies any change in events of daily living and is without any trauma. We discussed patient's condition and will continue presently prescribed medications at this time. The patient was understanding and in agreement with suggested treatment plan.   Review of Systems     Objective:   Physical Exam  There was mild tinnitus of the splenius capitis and occipitalis musculature region palpation which reproduces pain on the left as well as on the right. No masses of the head and neck were noted. There was tenderness over the region of the cervical facet cervical paraspinal musculature region as well as the thoracic facet thoracic paraspinal musculature region with tenderness to palpation over the cervical facets on the left as well as on the right and thoracic facets on the left as well as on the right. Mild tinnitus of the acromioclavicular glenohumeral joint region. Patient appeared to be with bilaterally equal grip strength. Tinel and Phalen's maneuver were without increase of pain of significant degree. There was tenderness over the region of the lumbar paraspinal musculature region lumbar facet region a moderate degree lateral bending and rotation and extension to palpation of the lumbar facets reproduce moderate discomfort. Moderate tenderness over the PSIS and PII S regions. Mild tenderness of the greater trochanteric region iliotibial band region. Straight leg raising tolerated possibly 30 without increased pain with dorsiflexion noted no definite sensory  deficit of dermatomal distribution detected. Negative clonus negative Homans. On 10 and no costovertebral tenderness noted.      Assessment & Plan:  Degenerative disc disease lumbar spine L2-3, L 34, L4-5 disc bulges facet arthropathy severe spinal canal stenosis L4-L5 disc bulging and multilevel facet arthropathy, osteopenia and scoliosis severe spinal canal stenosis L4-5 disc bulging and arthropathy retrolisthesis the right L2-3, L3-4,  Lumbar facet syndrome  Lumbar radiculopathy  Sacroiliac joint dysfunction  Bilateral occipital neuralgia   Continue present medications L and hydrocodone acetaminophen  F/U PCP Dr. Lisette Grinder III  for evaliation of  BP and general medical  condition.  F/U surgical evaluation  F/U neurological evaluation  F/U psych eval Dr. Reece Levy as discussed  May consider radiofrequency rhizolysis or intraspinal procedures pending response to present treatment and F/U evaluation.  Patient to call Pain Management Center should patient have concerns prior to scheduled return appointment.

## 2014-08-22 NOTE — Progress Notes (Signed)
Safety precautions to be maintained throughout the outpatient stay will include: orient to surroundings, keep bed in low position, maintain call bell within reach at all times, provide assistance with transfer out of bed and ambulation.  Script given for norco

## 2014-09-08 ENCOUNTER — Other Ambulatory Visit: Payer: Self-pay | Admitting: Pain Medicine

## 2014-09-22 ENCOUNTER — Ambulatory Visit: Payer: BC Managed Care – PPO | Attending: Pain Medicine | Admitting: Pain Medicine

## 2014-09-22 VITALS — BP 100/81 | HR 102 | Temp 97.4°F | Resp 16 | Ht 63.0 in | Wt 170.0 lb

## 2014-09-22 DIAGNOSIS — M4186 Other forms of scoliosis, lumbar region: Secondary | ICD-10-CM | POA: Insufficient documentation

## 2014-09-22 DIAGNOSIS — M47816 Spondylosis without myelopathy or radiculopathy, lumbar region: Secondary | ICD-10-CM

## 2014-09-22 DIAGNOSIS — M47818 Spondylosis without myelopathy or radiculopathy, sacral and sacrococcygeal region: Secondary | ICD-10-CM

## 2014-09-22 DIAGNOSIS — M5136 Other intervertebral disc degeneration, lumbar region: Secondary | ICD-10-CM | POA: Insufficient documentation

## 2014-09-22 DIAGNOSIS — M533 Sacrococcygeal disorders, not elsewhere classified: Secondary | ICD-10-CM | POA: Insufficient documentation

## 2014-09-22 DIAGNOSIS — M79605 Pain in left leg: Secondary | ICD-10-CM | POA: Diagnosis present

## 2014-09-22 DIAGNOSIS — M79604 Pain in right leg: Secondary | ICD-10-CM | POA: Diagnosis present

## 2014-09-22 DIAGNOSIS — M8580 Other specified disorders of bone density and structure, unspecified site: Secondary | ICD-10-CM | POA: Insufficient documentation

## 2014-09-22 DIAGNOSIS — M5416 Radiculopathy, lumbar region: Secondary | ICD-10-CM | POA: Diagnosis not present

## 2014-09-22 DIAGNOSIS — M545 Low back pain: Secondary | ICD-10-CM | POA: Diagnosis present

## 2014-09-22 DIAGNOSIS — M5481 Occipital neuralgia: Secondary | ICD-10-CM | POA: Diagnosis not present

## 2014-09-22 DIAGNOSIS — M51369 Other intervertebral disc degeneration, lumbar region without mention of lumbar back pain or lower extremity pain: Secondary | ICD-10-CM

## 2014-09-22 DIAGNOSIS — M5126 Other intervertebral disc displacement, lumbar region: Secondary | ICD-10-CM | POA: Insufficient documentation

## 2014-09-22 DIAGNOSIS — M461 Sacroiliitis, not elsewhere classified: Secondary | ICD-10-CM

## 2014-09-22 DIAGNOSIS — M1288 Other specific arthropathies, not elsewhere classified, other specified site: Secondary | ICD-10-CM | POA: Diagnosis not present

## 2014-09-22 MED ORDER — HYDROCODONE-ACETAMINOPHEN 5-325 MG PO TABS: ORAL_TABLET | ORAL | Status: DC

## 2014-09-22 MED ORDER — CYCLOBENZAPRINE HCL 10 MG PO TABS: ORAL_TABLET | ORAL | Status: DC

## 2014-09-22 NOTE — Progress Notes (Signed)
   Subjective:    Patient ID: Meagan Bridges, female    DOB: 18-Jun-1965, 49 y.o.   MRN: [id number]  HPI Patient is 49 year old female returns to [facility] for further evaluation and treatment of pain involving the lower back lower extremity region with pain occurring in the region of the neck and headaches of lesser degree. Patient states she has noticed some increased lower back lower extremity pain and that she is a unable to keep up with her husband when walking in the mall and other places. We discussed interventional treatment and will remain available to proceed with interventional treatment as discussed with patient. We reviewed patient's MRI on today's visit and will consider further evaluation including surgical evaluation should patient be without significant improvement of her condition and without significant response to interventional treatment. The patient was understanding and in agreement status treatment plan and will inform us of when she is ready to undergo interventional treatment. Patient also states she was having difficulty staying awake in class. We recommend patient to follow-up with Dr. Reece Levy in this regard. Patient will follow-up Dr. Reece Levy as discussed to address the issue of falling asleep in class. We will remain available to consider interventional treatment pending patient decides to proceed with such treatment and we will continue present medications at this time.       Review of Systems     Objective:   Physical Exam  There was tenderness over the splenius capitis and occipitalis muscles of mild/moderate degree. There was unremarkable Spurling's maneuver. Patient appeared to be with bilaterally equal grip strength. Palpation of the cervical and thoracic paraspinal muscles reproduced mild to moderate discomfort. There was no crepitus of the thoracic region noted. Tinel and Phalen's maneuver were without increase of pain of significant degree. There was  mild tenderness of the acromioclavicular and glenohumeral joint regions. Palpation over the lower thoracic paraspinal muscles and lower thoracic facet regions reproduced moderate discomfort. There was tenderness over the PSIS and PII S regions left greater than the right and straight leg raising was tolerates approximately 20 without increased pain with dorsiflexion noted. DTRs were difficult to elicit. Lateral bending and rotation and extension and palpation over the lumbar facets reproduce moderate discomfort. There was mild tinnitus of the greater trochanteric region. There was negative clonus negative Homans. No definite sensory deficit of dermatomal distribution was detected. Abdomen was nontender and no costovertebral angle tenderness was noted.      Assessment & Plan:    Degenerative disc disease lumbar spine L2-3, L 34, L4-5 disc bulges facet arthropathy severe spinal canal stenosis L4-L5 disc bulging and multilevel facet arthropathy, osteopenia and scoliosis severe spinal canal stenosis L4-5 disc bulging and arthropathy retrolisthesis the right L2-3, L3-4,  Lumbar facet syndrome  Lumbar radiculopathy  Sacroiliac joint dysfunction  Bilateral occipital neuralgia    Plan   Continue present medications Flexeril and oxycodone  F/U PCP Dr. Lerry Paterson  for evaliation of  BP and general medical  condition  F/U surgical evaluation  to be considered as discussed  F/U neurological evaluation  Appointment with Dr. Reece Levy to address issue of falling asleep in class and general follow-up evaluation   May consider radiofrequency rhizolysis or intraspinal procedures pending response to present treatment and F/U evaluation   Patient to call Pain Management Center should patient have concerns prior to scheduled return appointmen.

## 2014-09-22 NOTE — Patient Instructions (Signed)
Continue present medications Flexeril and hydrocodone acetaminophen  F/U PCP Dr. Candie Chroman III for evaliation of  BP and general medical  condition  F/U surgical evaluation  F/U neurological evaluation  F/U Dr.Reddy    May consider radiofrequency rhizolysis or intraspinal procedures pending response to present treatment and F/U evaluation   Patient to call Pain Management Center should patient have concerns prior to scheduled return appointmen.

## 2014-09-22 NOTE — Progress Notes (Signed)
Safety precautions to be maintained throughout the outpatient stay will include: orient to surroundings, keep bed in low position, maintain call bell within reach at all times, provide assistance with transfer out of bed and ambulation.  

## 2014-10-19 ENCOUNTER — Ambulatory Visit: Payer: BC Managed Care – PPO | Attending: Pain Medicine | Admitting: Pain Medicine

## 2014-10-19 ENCOUNTER — Encounter: Payer: Self-pay | Admitting: Pain Medicine

## 2014-10-19 VITALS — BP 113/96 | HR 90 | Temp 97.9°F | Resp 14 | Ht 63.0 in | Wt 173.0 lb

## 2014-10-19 DIAGNOSIS — M79605 Pain in left leg: Secondary | ICD-10-CM | POA: Diagnosis present

## 2014-10-19 DIAGNOSIS — M5416 Radiculopathy, lumbar region: Secondary | ICD-10-CM

## 2014-10-19 DIAGNOSIS — M533 Sacrococcygeal disorders, not elsewhere classified: Secondary | ICD-10-CM | POA: Insufficient documentation

## 2014-10-19 DIAGNOSIS — M545 Low back pain: Secondary | ICD-10-CM | POA: Diagnosis present

## 2014-10-19 DIAGNOSIS — M47896 Other spondylosis, lumbar region: Secondary | ICD-10-CM | POA: Diagnosis not present

## 2014-10-19 DIAGNOSIS — M79604 Pain in right leg: Secondary | ICD-10-CM | POA: Diagnosis present

## 2014-10-19 DIAGNOSIS — M5136 Other intervertebral disc degeneration, lumbar region: Secondary | ICD-10-CM

## 2014-10-19 DIAGNOSIS — M5126 Other intervertebral disc displacement, lumbar region: Secondary | ICD-10-CM | POA: Diagnosis not present

## 2014-10-19 DIAGNOSIS — M7060 Trochanteric bursitis, unspecified hip: Secondary | ICD-10-CM | POA: Diagnosis not present

## 2014-10-19 DIAGNOSIS — M5481 Occipital neuralgia: Secondary | ICD-10-CM | POA: Diagnosis not present

## 2014-10-19 DIAGNOSIS — M47816 Spondylosis without myelopathy or radiculopathy, lumbar region: Secondary | ICD-10-CM

## 2014-10-19 DIAGNOSIS — M47818 Spondylosis without myelopathy or radiculopathy, sacral and sacrococcygeal region: Secondary | ICD-10-CM

## 2014-10-19 DIAGNOSIS — M461 Sacroiliitis, not elsewhere classified: Secondary | ICD-10-CM

## 2014-10-19 MED ORDER — CYCLOBENZAPRINE HCL 10 MG PO TABS: ORAL_TABLET | ORAL | Status: DC

## 2014-10-19 MED ORDER — HYDROCODONE-ACETAMINOPHEN 5-325 MG PO TABS: ORAL_TABLET | ORAL | Status: DC

## 2014-10-19 NOTE — Progress Notes (Signed)
   Subjective:    Patient ID: Meagan Bridges, female    DOB: 21-Sep-1965, 49 y.o.   MRN: [id number]  HPI  Patient is 49 year old female returns to pain management for further evaluation and treatment of pain involving the lower back lower extremity region. Patient also has had history of pain occurring in the region of the neck associated with headaches. At the present time patient states most bothersome pain involves the lower back and lower extremity region especially the left lower extremity. We discussed patient's condition present time we will continue Flexeril and hydrocodone acetaminophen and cup patient will call Pain Management Center should she wish to proceed with interventional treatment as discussed. Patient will follow-up with Dr.Reddy and Dr. Lerry Paterson as planned. Patient was in agreement with suggested treatment plan       Review of Systems     Objective:   Physical Exam  There was mild tenderness of the splenius capitis and occipitalis musculature region. Mild tends of the recent cervical facet cervical paraspinal muscles region was noted. Patient appeared to be with unremarkable Spurling's maneuver and was with bilaterally equal grip strength. Tinel and Phalen's maneuver without increased pain of significant degree. There was mild tends of the thoracic facet thoracic paraspinal musculature region. No crepitus of the thoracic region was noted. Palpation over the lumbar paraspinal muscles lumbar facet region associated with mild to moderate tends to palpation left greater than right. Extension and palpation lumbar facets reproduce moderate discomfort. Straight leg raising limited to approximately 30 without increased pain dorsiflexion noted. No definite sensory deficit of dermatomal distribution detected. Moderate discomfort and palpation over the PSIS and PII S region moderate to moderately severe tinged palpation of the greater trochanteric region and iliotibial band region.  Negative clonus negative Homans. Abdomen nontender and no costovertebral tenderness noted.      Assessment & Plan:     Degenerative changes lumbar spine  L4-L5 disc protrusion, with narrowing of the left neural foramen at this level, L4-5 annular disc bulging and left paracentral disc protrusion.   Lumbar facet syndrome   Lumbar radiculopathy   sacroiliac joint dysfunction  Greater trochanteric bursitis   Greater occipital neuralgia     PLAN   Continue present medication Flexeril and hydrocodone acetaminophen  F/U PCP Dr. Lisette Grinder III  for evaliation of  BP and general medical  condition  F/U surgical evaluation. May consider pending follow-up evaluations  F/U neurological evaluation. May consider pending follow-up evaluations  F/U Dr.Reddy as planned   May consider radiofrequency rhizolysis or intraspinal procedures pending response to present treatment and F/U evaluation   Patient to call Pain Management Center should patient have concerns prior to scheduled return appointment.

## 2014-10-19 NOTE — Progress Notes (Signed)
Safety precautions to be maintained throughout the outpatient stay will include: orient to surroundings, keep bed in low position, maintain call bell within reach at all times, provide assistance with transfer out of bed and ambulation.   Discharged ambulatory at 1:45 pm.

## 2014-10-19 NOTE — Patient Instructions (Addendum)
PLAN   Continue present medication Flexeril and hydrocodone acetaminophen   F/U PCP Dr. Lisette Grinder  III for evaliation of  BP and general medical  condition  F/U surgical evaluation. May consider pending follow-up evaluations  F/U neurological evaluation. May consider pending follow-up evaluations  F/U Dr Reece Levy as planned  May consider radiofrequency rhizolysis or intraspinal procedures pending response to present treatment and F/U evaluation   Patient to call [facility] should patient have concerns prior to scheduled return appointment.  A prescription for FLEXERIL was sent to your pharmacy and should be available for pickup today. A prescription for HYDROCODONE was given to you today.

## 2014-11-16 ENCOUNTER — Encounter: Payer: Self-pay | Admitting: Pain Medicine

## 2014-11-16 ENCOUNTER — Ambulatory Visit: Payer: BLUE CROSS/BLUE SHIELD | Attending: Pain Medicine | Admitting: Pain Medicine

## 2014-11-16 VITALS — BP 119/70 | HR 99 | Temp 97.6°F | Resp 16 | Ht 63.0 in | Wt 170.0 lb

## 2014-11-16 DIAGNOSIS — M5136 Other intervertebral disc degeneration, lumbar region: Secondary | ICD-10-CM | POA: Diagnosis not present

## 2014-11-16 DIAGNOSIS — M51369 Other intervertebral disc degeneration, lumbar region without mention of lumbar back pain or lower extremity pain: Secondary | ICD-10-CM

## 2014-11-16 DIAGNOSIS — M5126 Other intervertebral disc displacement, lumbar region: Secondary | ICD-10-CM | POA: Diagnosis not present

## 2014-11-16 DIAGNOSIS — M7060 Trochanteric bursitis, unspecified hip: Secondary | ICD-10-CM | POA: Diagnosis not present

## 2014-11-16 DIAGNOSIS — M533 Sacrococcygeal disorders, not elsewhere classified: Secondary | ICD-10-CM | POA: Diagnosis not present

## 2014-11-16 DIAGNOSIS — M47816 Spondylosis without myelopathy or radiculopathy, lumbar region: Secondary | ICD-10-CM

## 2014-11-16 DIAGNOSIS — M5416 Radiculopathy, lumbar region: Secondary | ICD-10-CM | POA: Diagnosis not present

## 2014-11-16 DIAGNOSIS — M5481 Occipital neuralgia: Secondary | ICD-10-CM | POA: Diagnosis not present

## 2014-11-16 DIAGNOSIS — M47818 Spondylosis without myelopathy or radiculopathy, sacral and sacrococcygeal region: Secondary | ICD-10-CM

## 2014-11-16 DIAGNOSIS — M545 Low back pain: Secondary | ICD-10-CM | POA: Diagnosis present

## 2014-11-16 DIAGNOSIS — M461 Sacroiliitis, not elsewhere classified: Secondary | ICD-10-CM

## 2014-11-16 DIAGNOSIS — M79605 Pain in left leg: Secondary | ICD-10-CM | POA: Diagnosis present

## 2014-11-16 DIAGNOSIS — M79604 Pain in right leg: Secondary | ICD-10-CM | POA: Diagnosis present

## 2014-11-16 MED ORDER — HYDROCODONE-ACETAMINOPHEN 5-325 MG PO TABS: ORAL_TABLET | ORAL | Status: DC

## 2014-11-16 NOTE — Progress Notes (Signed)
   Subjective:    Patient ID: Meagan Bridges, female    DOB: [birth year], 49 y.o.   MRN: [id number]  HPI Patient is 49 year old female returns to [place] for further evaluation and treatment of pain involving the lower back lower extremity region with occasional pain involving the neck associated with headaches. Patient states that she has significant pain involving the lower back lower extremity region with radiation toward the right lower extremity. Patient states the pain is aggravated standing walking and becoming more intense as the day progresses. Patient states that she is in need of procedure in attempt to decrease severity of symptoms, minimize progression of symptoms, and avoid the need for more involved treatment. The patient was understanding and in agreement status treatment plan of proceeding with lumbar epidural steroid injection at time return appointment. We have also discuss surgical evaluation which patient prefers to avoid at this time.   Review of Systems     Objective:   Physical Exam  There was tenderness to palpation of the splenius capitis and occipitalis musculature regions of mild degree. There was mild tenderness of the cervical facet cervical paraspinal musculature region. Palpation over the thoracic facet thoracic paraspinal musculature region was with tenderness to palpation of mild degree with moderate tends to palpation of the lower thoracic paraspinal musculature region. There was no crepitus of the thoracic region noted. There appeared to be unremarkable Spurling's maneuver. Palpation of the acromioclavicular and glenohumeral joint regions were without increased pain of significant degree. Palpation over the lumbar paraspinal musculature region lumbar facet region was a tends to palpation of moderate to moderately severe degree right greater than left. Extension and palpation of the lumbar facets reproduce moderately severe discomfort right greater than  left. Straight leg raising decreased on the right compared to the left tolerates approximately 20 with questionable increase of pain with dorsiflexion noted. There was negative clonus negative Homans. Slightly decreased sensation along the L5 dermatomal distribution was noted. There was tenderness over the PSIS and PII S region as well as the gluteal and piriformis musculature regions reproduced mild discomfort. Abdomen was nontender with no costovertebral angle tenderness noted.      Assessment & Plan:   Degenerative changes lumbar spine  L4-L5 disc protrusion, with narrowing of the left neural foramen at this level, L4-5 annular disc bulging and left paracentral disc protrusion.   Lumbar facet syndrome   Lumbar radiculopathy   sacroiliac joint dysfunction  Greater trochanteric bursitis   Greater occipital neuralgia    PLAN   Continue present medication Flexeril and hydrocodone acetaminophen  Lumbar epidural steroid injection to be performed at time of return appointment  F/U PCP Dr. Lisette Grinder III  for evaliation of  BP and general medical  condition  F/U surgical evaluation. May consider pending follow-up evaluations  F/U neurological evaluation. May consider pending follow-up evaluations  F/U Dr Reece Levy as discussed  May consider radiofrequency rhizolysis or intraspinal procedures pending response to present treatment and F/U evaluation   Patient to call [facility] should patient have concerns prior to scheduled return appointment.

## 2014-11-16 NOTE — Patient Instructions (Addendum)
PLAN   Continue present medication Flexeril and hydrocodone acetaminophen  Lumbar epidural steroid injection to be performed at time of return appointment  F/U PCP Dr. Lisette Grinder III for evaliation of  BP and general medical  condition  F/U surgical evaluation. May consider pending follow-up evaluations  F/U neurological evaluation. May consider pending follow-up evaluations  F/U Dr.Reddy as discussed   May consider radiofrequency rhizolysis or intraspinal procedures pending response to present treatment and F/U evaluation   Patient to call [facility] should patient have concerns prior to scheduled return appointment. GENERAL RISKS AND COMPLICATIONS  What are the risk, side effects and possible complications? Generally speaking, most procedures are safe.  However, with any procedure there are risks, side effects, and the possibility of complications.  The risks and complications are dependent upon the sites that are lesioned, or the type of nerve block to be performed.  The closer the procedure is to the spine, the more serious the risks are.  Great care is taken when placing the radio frequency needles, block needles or lesioning probes, but sometimes complications can occur. 1. Infection: Any time there is an injection through the skin, there is a risk of infection.  This is why sterile conditions are used for these blocks.  There are four possible types of infection. 1. Localized skin infection. 2. Central Nervous System Infection-This can be in the form of Meningitis, which can be deadly. 3. Epidural Infections-This can be in the form of an epidural abscess, which can cause pressure inside of the spine, causing compression of the spinal cord with subsequent paralysis. This would require an emergency surgery to decompress, and there are no guarantees that the patient would recover from the paralysis. 4. Discitis-This is an infection of the intervertebral discs.  It occurs in  about 1% of discography procedures.  It is difficult to treat and it may lead to surgery.        2. Pain: the needles have to go through skin and soft tissues, will cause soreness.       3. Damage to internal structures:  The nerves to be lesioned may be near blood vessels or    other nerves which can be potentially damaged.       4. Bleeding: Bleeding is more common if the patient is taking blood thinners such as  aspirin, Coumadin, Ticiid, Plavix, etc., or if he/she have some genetic predisposition  such as hemophilia. Bleeding into the spinal canal can cause compression of the spinal  cord with subsequent paralysis.  This would require an emergency surgery to  decompress and there are no guarantees that the patient would recover from the  paralysis.       5. Pneumothorax:  Puncturing of a lung is a possibility, every time a needle is introduced in  the area of the chest or upper back.  Pneumothorax refers to free air around the  collapsed lung(s), inside of the thoracic cavity (chest cavity).  Another two possible  complications related to a similar event would include: Hemothorax and Chylothorax.   These are variations of the Pneumothorax, where instead of air around the collapsed  lung(s), you may have blood or chyle, respectively.       6. Spinal headaches: They may occur with any procedures in the area of the spine.       7. Persistent CSF (Cerebro-Spinal Fluid) leakage: This is a rare problem, but may occur  with prolonged intrathecal or epidural catheters either due  to the formation of a fistulous  track or a dural tear.       8. Nerve damage: By working so close to the spinal cord, there is always a possibility of  nerve damage, which could be as serious as a permanent spinal cord injury with  paralysis.       9. Death:  Although rare, severe deadly allergic reactions known as "Anaphylactic  reaction" can occur to any of the medications used.      10. Worsening of the symptoms:  We can always  make thing worse.  What are the chances of something like this happening? Chances of any of this occuring are extremely low.  By statistics, you have more of a chance of getting killed in a motor vehicle accident: while driving to the hospital than any of the above occurring .  Nevertheless, you should be aware that they are possibilities.  In general, it is similar to taking a shower.  Everybody knows that you can slip, hit your head and get killed.  Does that mean that you should not shower again?  Nevertheless always keep in mind that statistics do not mean anything if you happen to be on the wrong side of them.  Even if a procedure has a 1 (one) in a 1,000,000 (million) chance of going wrong, it you happen to be that one..Also, keep in mind that by statistics, you have more of a chance of having something go wrong when taking medications.  Who should not have this procedure? If you are on a blood thinning medication (e.g. Coumadin, Plavix, see list of "Blood Thinners"), or if you have an active infection going on, you should not have the procedure.  If you are taking any blood thinners, please inform your physician.  How should I prepare for this procedure?  Do not eat or drink anything at least six hours prior to the procedure.  Bring a driver with you .  It cannot be a taxi.  Come accompanied by an adult that can drive you back, and that is strong enough to help you if your legs get weak or numb from the local anesthetic.  Take all of your medicines the morning of the procedure with just enough water to swallow them.  If you have diabetes, make sure that you are scheduled to have your procedure done first thing in the morning, whenever possible.  If you have diabetes, take only half of your insulin dose and notify our nurse that you have done so as soon as you arrive at the clinic.  If you are diabetic, but only take blood sugar pills (oral hypoglycemic), then do not take them on the  morning of your procedure.  You may take them after you have had the procedure.  Do not take aspirin or any aspirin-containing medications, at least eleven (11) days prior to the procedure.  They may prolong bleeding.  Wear loose fitting clothing that may be easy to take off and that you would not mind if it got stained with Betadine or blood.  Do not wear any jewelry or perfume  Remove any nail coloring.  It will interfere with some of our monitoring equipment.  NOTE: Remember that this is not meant to be interpreted as a complete list of all possible complications.  Unforeseen problems may occur.  BLOOD THINNERS The following drugs contain aspirin or other products, which can cause increased bleeding during surgery and should not be taken for 2 weeks  prior to and 1 week after surgery.  If you should need take something for relief of minor pain, you may take acetaminophen which is found in Tylenol,m Datril, Anacin-3 and Panadol. It is not blood thinner. The products listed below are.  Do not take any of the products listed below in addition to any listed on your instruction sheet.  A.P.C or A.P.C with Codeine Codeine Phosphate Capsules #3 Ibuprofen Ridaura  ABC compound Congesprin Imuran rimadil  Advil Cope Indocin Robaxisal  Alka-Seltzer Effervescent Pain Reliever and Antacid Coricidin or Coricidin-D  Indomethacin Rufen  Alka-Seltzer plus Cold Medicine Cosprin Ketoprofen S-A-C Tablets  Anacin Analgesic Tablets or Capsules Coumadin Korlgesic Salflex  Anacin Extra Strength Analgesic tablets or capsules CP-2 Tablets Lanoril Salicylate  Anaprox Cuprimine Capsules Levenox Salocol  Anexsia-D Dalteparin Magan Salsalate  Anodynos Darvon compound Magnesium Salicylate Sine-off  Ansaid Dasin Capsules Magsal Sodium Salicylate  Anturane Depen Capsules Marnal Soma  APF Arthritis pain formula Dewitt's Pills Measurin Stanback  Argesic Dia-Gesic Meclofenamic Sulfinpyrazone  Arthritis Bayer Timed  Release Aspirin Diclofenac Meclomen Sulindac  Arthritis pain formula Anacin Dicumarol Medipren Supac  Analgesic (Safety coated) Arthralgen Diffunasal Mefanamic Suprofen  Arthritis Strength Bufferin Dihydrocodeine Mepro Compound Suprol  Arthropan liquid Dopirydamole Methcarbomol with Aspirin Synalgos  ASA tablets/Enseals Disalcid Micrainin Tagament  Ascriptin Doan's Midol Talwin  Ascriptin A/D Dolene Mobidin Tanderil  Ascriptin Extra Strength Dolobid Moblgesic Ticlid  Ascriptin with Codeine Doloprin or Doloprin with Codeine Momentum Tolectin  Asperbuf Duoprin Mono-gesic Trendar  Aspergum Duradyne Motrin or Motrin IB Triminicin  Aspirin plain, buffered or enteric coated Durasal Myochrisine Trigesic  Aspirin Suppositories Easprin Nalfon Trillsate  Aspirin with Codeine Ecotrin Regular or Extra Strength Naprosyn Uracel  Atromid-S Efficin Naproxen Ursinus  Auranofin Capsules Elmiron Neocylate Vanquish  Axotal Emagrin Norgesic Verin  Azathioprine Empirin or Empirin with Codeine Normiflo Vitamin E  Azolid Emprazil Nuprin Voltaren  Bayer Aspirin plain, buffered or children's or timed BC Tablets or powders Encaprin Orgaran Warfarin Sodium  Buff-a-Comp Enoxaparin Orudis Zorpin  Buff-a-Comp with Codeine Equegesic Os-Cal-Gesic   Buffaprin Excedrin plain, buffered or Extra Strength Oxalid   Bufferin Arthritis Strength Feldene Oxphenbutazone   Bufferin plain or Extra Strength Feldene Capsules Oxycodone with Aspirin   Bufferin with Codeine Fenoprofen Fenoprofen Pabalate or Pabalate-SF   Buffets II Flogesic Panagesic   Buffinol plain or Extra Strength Florinal or Florinal with Codeine Panwarfarin   Buf-Tabs Flurbiprofen Penicillamine   Butalbital Compound Four-way cold tablets Penicillin   Butazolidin Fragmin Pepto-Bismol   Carbenicillin Geminisyn Percodan   Carna Arthritis Reliever Geopen Persantine   Carprofen Gold's salt Persistin   Chloramphenicol Goody's Phenylbutazone   Chloromycetin  [place] Piroxlcam   Clmetidine heparin Plaquenil   Cllnoril Hyco-pap Ponstel   Clofibrate Hydroxy chloroquine Propoxyphen         Before stopping any of these medications, be sure to consult the physician who ordered them.  Some, such as Coumadin (Warfarin) are ordered to prevent or treat serious conditions such as "deep thrombosis", "pumonary embolisms", and other heart problems.  The amount of time that you may need off of the medication may also vary with the medication and the reason for which you were taking it.  If you are taking any of these medications, please make sure you notify your pain physician before you undergo any procedures.         Epidural Steroid Injection Patient Information  Description: The epidural space surrounds the nerves as they exit the spinal cord.  In some patients, the nerves can  be compressed and inflamed by a bulging disc or a tight spinal canal (spinal stenosis).  By injecting steroids into the epidural space, we can bring irritated nerves into direct contact with a potentially helpful medication.  These steroids act directly on the irritated nerves and can reduce swelling and inflammation which often leads to decreased pain.  Epidural steroids may be injected anywhere along the spine and from the neck to the low back depending upon the location of your pain.   After numbing the skin with local anesthetic (like Novocaine), a small needle is passed into the epidural space slowly.  You may experience a sensation of pressure while this is being done.  The entire block usually last less than 10 minutes.  Conditions which may be treated by epidural steroids:   Low back and leg pain  Neck and arm pain  Spinal stenosis  Post-laminectomy syndrome  Herpes zoster (shingles) pain  Pain from compression fractures  Preparation for the injection:  1. Do not eat any solid food or dairy products within 6 hours of your appointment.  2. You may drink clear  liquids up to 2 hours before appointment.  Clear liquids include water, black coffee, juice or soda.  No milk or cream please. 3. You may take your regular medication, including pain medications, with a sip of water before your appointment  Diabetics should hold regular insulin (if taken separately) and take 1/2 normal NPH dos the morning of the procedure.  Carry some sugar containing items with you to your appointment. 4. A driver must accompany you and be prepared to drive you home after your procedure.  5. Bring all your current medications with your. 6. An IV may be inserted and sedation may be given at the discretion of the physician.   7. A blood pressure cuff, EKG and other monitors will often be applied during the procedure.  Some patients may need to have extra oxygen administered for a short period. 8. You will be asked to provide medical information, including your allergies, prior to the procedure.  We must know immediately if you are taking blood thinners (like Coumadin/Warfarin)  Or if you are allergic to IV iodine contrast (dye). We must know if you could possible be pregnant.  Possible side-effects:  Bleeding from needle site  Infection (rare, may require surgery)  Nerve injury (rare)  Numbness & tingling (temporary)  Difficulty urinating (rare, temporary)  Spinal headache ( a headache worse with upright posture)  Light -headedness (temporary)  Pain at injection site (several days)  Decreased blood pressure (temporary)  Weakness in arm/leg (temporary)  Pressure sensation in back/neck (temporary)  Call if you experience:  Fever/chills associated with headache or increased back/neck pain.  Headache worsened by an upright position.  New onset weakness or numbness of an extremity below the injection site  Hives or difficulty breathing (go to the emergency room)  Inflammation or drainage at the infection site  Severe back/neck pain  Any new symptoms which are  concerning to you  Please note:  Although the local anesthetic injected can often make your back or neck feel good for several hours after the injection, the pain will likely return.  It takes 3-7 days for steroids to work in the epidural space.  You may not notice any pain relief for at least that one week.  If effective, we will often do a series of three injections spaced 3-6 weeks apart to maximally decrease your pain.  After the initial series,  we generally will wait several months before considering a repeat injection of the same type.  If you have any questions, please call [phone] [facility]

## 2014-11-23 ENCOUNTER — Encounter: Payer: Self-pay | Admitting: Pain Medicine

## 2014-11-23 ENCOUNTER — Ambulatory Visit: Payer: BLUE CROSS/BLUE SHIELD | Attending: Pain Medicine | Admitting: Pain Medicine

## 2014-11-23 VITALS — BP 103/63 | HR 95 | Temp 98.1°F | Resp 18 | Ht 63.0 in | Wt 170.0 lb

## 2014-11-23 DIAGNOSIS — M545 Low back pain: Secondary | ICD-10-CM | POA: Diagnosis present

## 2014-11-23 DIAGNOSIS — M79604 Pain in right leg: Secondary | ICD-10-CM | POA: Diagnosis present

## 2014-11-23 DIAGNOSIS — M5126 Other intervertebral disc displacement, lumbar region: Secondary | ICD-10-CM | POA: Diagnosis not present

## 2014-11-23 DIAGNOSIS — M47816 Spondylosis without myelopathy or radiculopathy, lumbar region: Secondary | ICD-10-CM

## 2014-11-23 DIAGNOSIS — M5136 Other intervertebral disc degeneration, lumbar region: Secondary | ICD-10-CM

## 2014-11-23 DIAGNOSIS — M5416 Radiculopathy, lumbar region: Secondary | ICD-10-CM

## 2014-11-23 DIAGNOSIS — M51369 Other intervertebral disc degeneration, lumbar region without mention of lumbar back pain or lower extremity pain: Secondary | ICD-10-CM

## 2014-11-23 DIAGNOSIS — M47818 Spondylosis without myelopathy or radiculopathy, sacral and sacrococcygeal region: Secondary | ICD-10-CM

## 2014-11-23 DIAGNOSIS — M5481 Occipital neuralgia: Secondary | ICD-10-CM

## 2014-11-23 DIAGNOSIS — M79605 Pain in left leg: Secondary | ICD-10-CM | POA: Diagnosis present

## 2014-11-23 DIAGNOSIS — M461 Sacroiliitis, not elsewhere classified: Secondary | ICD-10-CM

## 2014-11-23 MED ORDER — ORPHENADRINE CITRATE 30 MG/ML IJ SOLN
60.0000 mg | Freq: Once | INTRAMUSCULAR | Status: AC
Start: 2014-11-23 — End: 2014-11-23
  Administered 2014-11-23: 12:00:00 via INTRAMUSCULAR

## 2014-11-23 MED ORDER — SODIUM CHLORIDE 0.9 % IJ SOLN
20.0000 mL | Freq: Once | INTRAMUSCULAR | Status: AC
  Administered 2014-11-23: 12:00:00

## 2014-11-23 MED ORDER — TRIAMCINOLONE ACETONIDE 40 MG/ML IJ SUSP
40.0000 mg | Freq: Once | INTRAMUSCULAR | Status: AC
  Administered 2014-11-23: 13:00:00

## 2014-11-23 MED ORDER — TRIAMCINOLONE ACETONIDE 40 MG/ML IJ SUSP
INTRAMUSCULAR | Status: AC
  Filled 2014-11-23: qty 1

## 2014-11-23 MED ORDER — BUPIVACAINE HCL (PF) 0.25 % IJ SOLN
30.0000 mL | Freq: Once | INTRAMUSCULAR | Status: AC
  Administered 2014-11-23: 12:00:00

## 2014-11-23 MED ORDER — FENTANYL CITRATE (PF) 100 MCG/2ML IJ SOLN
INTRAMUSCULAR | Status: AC
  Administered 2014-11-23: 100 ug via INTRAVENOUS
  Filled 2014-11-23: qty 2

## 2014-11-23 MED ORDER — LIDOCAINE HCL (PF) 1 % IJ SOLN
INTRAMUSCULAR | Status: AC
  Filled 2014-11-23: qty 5

## 2014-11-23 MED ORDER — FENTANYL CITRATE (PF) 100 MCG/2ML IJ SOLN
100.0000 ug | Freq: Once | INTRAMUSCULAR | Status: AC
  Administered 2014-11-23: 100 ug via INTRAVENOUS

## 2014-11-23 MED ORDER — SODIUM CHLORIDE 0.9 % IJ SOLN
INTRAMUSCULAR | Status: AC
  Filled 2014-11-23: qty 20

## 2014-11-23 MED ORDER — BUPIVACAINE HCL (PF) 0.25 % IJ SOLN
INTRAMUSCULAR | Status: AC
  Filled 2014-11-23: qty 30

## 2014-11-23 MED ORDER — MIDAZOLAM HCL 5 MG/5ML IJ SOLN
5.0000 mg | Freq: Once | INTRAMUSCULAR | Status: AC
  Administered 2014-11-23: 5 mg via INTRAVENOUS

## 2014-11-23 MED ORDER — ORPHENADRINE CITRATE 30 MG/ML IJ SOLN
INTRAMUSCULAR | Status: AC
  Filled 2014-11-23: qty 2

## 2014-11-23 MED ORDER — LACTATED RINGERS IV SOLN: 1000.0000 mL | INTRAVENOUS | Status: DC

## 2014-11-23 MED ORDER — LIDOCAINE HCL (PF) 1 % IJ SOLN
10.0000 mL | Freq: Once | INTRAMUSCULAR | Status: AC
  Administered 2014-11-23: 12:00:00 via SUBCUTANEOUS

## 2014-11-23 MED ORDER — MIDAZOLAM HCL 5 MG/5ML IJ SOLN
INTRAMUSCULAR | Status: AC
  Administered 2014-11-23: 5 mg via INTRAVENOUS
  Filled 2014-11-23: qty 5

## 2014-11-23 NOTE — Progress Notes (Signed)
Safety precautions to be maintained throughout the outpatient stay will include: orient to surroundings, keep bed in low position, maintain call bell within reach at all times, provide assistance with transfer out of bed and ambulation.  

## 2014-11-23 NOTE — Progress Notes (Signed)
   Subjective:    Patient ID: Meagan Bridges, female    DOB: [birth year], 49 y.o.   MRN: [id number]  HPI    Review of Systems     Objective:   Physical Exam        Assessment & Plan:

## 2014-11-23 NOTE — Patient Instructions (Addendum)
PLAN   Continue present medication Flexeril and hydrocodone acetaminophen  F/U PCP  Dr. Lisette Grinder  III  for evaliation of  BP and general medical  condition  F/U surgical evaluation. May consider pending follow-up evaluations  F/U neurological evaluation. May consider pending follow-up evaluations  F/U Dr.Reddy as planned    May consider radiofrequency rhizolysis or intraspinal procedures pending response to present treatment and F/U evaluation   Patient to call Pain Management Center should patient have concerns prior to scheduled return appointment. GENERAL RISKS AND COMPLICATIONS  What are the risk, side effects and possible complications? Generally speaking, most procedures are safe.  However, with any procedure there are risks, side effects, and the possibility of complications.  The risks and complications are dependent upon the sites that are lesioned, or the type of nerve block to be performed.  The closer the procedure is to the spine, the more serious the risks are.  Great care is taken when placing the radio frequency needles, block needles or lesioning probes, but sometimes complications can occur. 1. Infection: Any time there is an injection through the skin, there is a risk of infection.  This is why sterile conditions are used for these blocks.  There are four possible types of infection. 1. Localized skin infection. 2. Central Nervous System Infection-This can be in the form of Meningitis, which can be deadly. 3. Epidural Infections-This can be in the form of an epidural abscess, which can cause pressure inside of the spine, causing compression of the spinal cord with subsequent paralysis. This would require an emergency surgery to decompress, and there are no guarantees that the patient would recover from the paralysis. 4. Discitis-This is an infection of the intervertebral discs.  It occurs in about 1% of discography procedures.  It is difficult to treat and it may lead to  surgery.        2. Pain: the needles have to go through skin and soft tissues, will cause soreness.       3. Damage to internal structures:  The nerves to be lesioned may be near blood vessels or    other nerves which can be potentially damaged.       4. Bleeding: Bleeding is more common if the patient is taking blood thinners such as  aspirin, Coumadin, Ticiid, Plavix, etc., or if he/she have some genetic predisposition  such as hemophilia. Bleeding into the spinal canal can cause compression of the spinal  cord with subsequent paralysis.  This would require an emergency surgery to  decompress and there are no guarantees that the patient would recover from the  paralysis.       5. Pneumothorax:  Puncturing of a lung is a possibility, every time a needle is introduced in  the area of the chest or upper back.  Pneumothorax refers to free air around the  collapsed lung(s), inside of the thoracic cavity (chest cavity).  Another two possible  complications related to a similar event would include: Hemothorax and Chylothorax.   These are variations of the Pneumothorax, where instead of air around the collapsed  lung(s), you may have blood or chyle, respectively.       6. Spinal headaches: They may occur with any procedures in the area of the spine.       7. Persistent CSF (Cerebro-Spinal Fluid) leakage: This is a rare problem, but may occur  with prolonged intrathecal or epidural catheters either due to the formation of a fistulous  track or  a dural tear.       8. Nerve damage: By working so close to the spinal cord, there is always a possibility of  nerve damage, which could be as serious as a permanent spinal cord injury with  paralysis.       9. Death:  Although rare, severe deadly allergic reactions known as "Anaphylactic  reaction" can occur to any of the medications used.      10. Worsening of the symptoms:  We can always make thing worse.  What are the chances of something like this  happening? Chances of any of this occuring are extremely low.  By statistics, you have more of a chance of getting killed in a motor vehicle accident: while driving to the hospital than any of the above occurring .  Nevertheless, you should be aware that they are possibilities.  In general, it is similar to taking a shower.  Everybody knows that you can slip, hit your head and get killed.  Does that mean that you should not shower again?  Nevertheless always keep in mind that statistics do not mean anything if you happen to be on the wrong side of them.  Even if a procedure has a 1 (one) in a 1,000,000 (million) chance of going wrong, it you happen to be that one..Also, keep in mind that by statistics, you have more of a chance of having something go wrong when taking medications.  Who should not have this procedure? If you are on a blood thinning medication (e.g. Coumadin, Plavix, see list of "Blood Thinners"), or if you have an active infection going on, you should not have the procedure.  If you are taking any blood thinners, please inform your physician.  How should I prepare for this procedure?  Do not eat or drink anything at least six hours prior to the procedure.  Bring a driver with you .  It cannot be a taxi.  Come accompanied by an adult that can drive you back, and that is strong enough to help you if your legs get weak or numb from the local anesthetic.  Take all of your medicines the morning of the procedure with just enough water to swallow them.  If you have diabetes, make sure that you are scheduled to have your procedure done first thing in the morning, whenever possible.  If you have diabetes, take only half of your insulin dose and notify our nurse that you have done so as soon as you arrive at the clinic.  If you are diabetic, but only take blood sugar pills (oral hypoglycemic), then do not take them on the morning of your procedure.  You may take them after you have had the  procedure.  Do not take aspirin or any aspirin-containing medications, at least eleven (11) days prior to the procedure.  They may prolong bleeding.  Wear loose fitting clothing that may be easy to take off and that you would not mind if it got stained with Betadine or blood.  Do not wear any jewelry or perfume  Remove any nail coloring.  It will interfere with some of our monitoring equipment.  NOTE: Remember that this is not meant to be interpreted as a complete list of all possible complications.  Unforeseen problems may occur.  BLOOD THINNERS The following drugs contain aspirin or other products, which can cause increased bleeding during surgery and should not be taken for 2 weeks prior to and 1 week after surgery.  If  you should need take something for relief of minor pain, you may take acetaminophen which is found in Tylenol,m Datril, Anacin-3 and Panadol. It is not blood thinner. The products listed below are.  Do not take any of the products listed below in addition to any listed on your instruction sheet.  A.P.C or A.P.C with Codeine Codeine Phosphate Capsules #3 Ibuprofen Ridaura  ABC compound Congesprin Imuran rimadil  Advil Cope Indocin Robaxisal  Alka-Seltzer Effervescent Pain Reliever and Antacid Coricidin or Coricidin-D  Indomethacin Rufen  Alka-Seltzer plus Cold Medicine Cosprin Ketoprofen S-A-C Tablets  Anacin Analgesic Tablets or Capsules Coumadin Korlgesic Salflex  Anacin Extra Strength Analgesic tablets or capsules CP-2 Tablets Lanoril Salicylate  Anaprox Cuprimine Capsules Levenox Salocol  Anexsia-D Dalteparin Magan Salsalate  Anodynos Darvon compound Magnesium Salicylate Sine-off  Ansaid Dasin Capsules Magsal Sodium Salicylate  Anturane Depen Capsules Marnal Soma  APF Arthritis pain formula Dewitt's Pills Measurin Stanback  Argesic Dia-Gesic Meclofenamic Sulfinpyrazone  Arthritis Bayer Timed Release Aspirin Diclofenac Meclomen Sulindac  Arthritis pain formula  Anacin Dicumarol Medipren Supac  Analgesic (Safety coated) Arthralgen Diffunasal Mefanamic Suprofen  Arthritis Strength Bufferin Dihydrocodeine Mepro Compound Suprol  Arthropan liquid Dopirydamole Methcarbomol with Aspirin Synalgos  ASA tablets/Enseals Disalcid Micrainin Tagament  Ascriptin Doan's Midol Talwin  Ascriptin A/D Dolene Mobidin Tanderil  Ascriptin Extra Strength Dolobid Moblgesic Ticlid  Ascriptin with Codeine Doloprin or Doloprin with Codeine Momentum Tolectin  Asperbuf Duoprin Mono-gesic Trendar  Aspergum Duradyne Motrin or Motrin IB Triminicin  Aspirin plain, buffered or enteric coated Durasal Myochrisine Trigesic  Aspirin Suppositories Easprin Nalfon Trillsate  Aspirin with Codeine Ecotrin Regular or Extra Strength Naprosyn Uracel  Atromid-S Efficin Naproxen Ursinus  Auranofin Capsules Elmiron Neocylate Vanquish  Axotal Emagrin Norgesic Verin  Azathioprine Empirin or Empirin with Codeine Normiflo Vitamin E  Azolid Emprazil Nuprin Voltaren  Bayer Aspirin plain, buffered or children's or timed BC Tablets or powders Encaprin Orgaran Warfarin Sodium  Buff-a-Comp Enoxaparin Orudis Zorpin  Buff-a-Comp with Codeine Equegesic Os-Cal-Gesic   Buffaprin Excedrin plain, buffered or Extra Strength Oxalid   Bufferin Arthritis Strength Feldene Oxphenbutazone   Bufferin plain or Extra Strength Feldene Capsules Oxycodone with Aspirin   Bufferin with Codeine Fenoprofen Fenoprofen Pabalate or Pabalate-SF   Buffets II Flogesic Panagesic   Buffinol plain or Extra Strength Florinal or Florinal with Codeine Panwarfarin   Buf-Tabs Flurbiprofen Penicillamine   Butalbital Compound Four-way cold tablets Penicillin   Butazolidin Fragmin Pepto-Bismol   Carbenicillin Geminisyn Percodan   Carna Arthritis Reliever Geopen Persantine   Carprofen Gold's salt Persistin   Chloramphenicol Goody's Phenylbutazone   Chloromycetin Haltrain Piroxlcam   Clmetidine heparin Plaquenil   Cllnoril Hyco-pap  Ponstel   Clofibrate Hydroxy chloroquine Propoxyphen         Before stopping any of these medications, be sure to consult the physician who ordered them.  Some, such as Coumadin (Warfarin) are ordered to prevent or treat serious conditions such as "deep thrombosis", "pumonary embolisms", and other heart problems.  The amount of time that you may need off of the medication may also vary with the medication and the reason for which you were taking it.  If you are taking any of these medications, please make sure you notify your pain physician before you undergo any procedures.         Epidural Steroid Injection An epidural steroid injection is given to relieve pain in your neck, back, or legs that is caused by the irritation or swelling of a nerve root. This procedure involves injecting a  steroid and numbing medicine (anesthetic) into the epidural space. The epidural space is the space between the outer covering of your spinal cord and the bones that form your backbone (vertebra).  LET [facility] CARE PROVIDER KNOW ABOUT:  2. Any allergies you have. 3. All medicines you are taking, including vitamins, herbs, eye drops, creams, and over-the-counter medicines such as aspirin. 4. Previous problems you or members of your family have had with the use of anesthetics. 5. Any blood disorders or blood clotting disorders you have. 6. Previous surgeries you have had. 7. Medical conditions you have. RISKS AND COMPLICATIONS Generally, this is a safe procedure. However, as with any procedure, complications can occur. Possible complications of epidural steroid injection include:  Headache.  Bleeding.  Infection.  Allergic reaction to the medicines.  Damage to your nerves. The response to this procedure depends on the underlying cause of the pain and its duration. People who have long-term (chronic) pain are less likely to benefit from epidural steroids than are those people whose pain comes on  strong and suddenly. BEFORE THE PROCEDURE   Ask your health care provider about changing or stopping your regular medicines. You may be advised to stop taking blood-thinning medicines a few days before the procedure.  You may be given medicines to reduce anxiety.  Arrange for someone to take you home after the procedure. PROCEDURE   You will remain awake during the procedure. You may receive medicine to make you relaxed.  You will be asked to lie on your stomach.  The injection site will be cleaned.  The injection site will be numbed with a medicine (local anesthetic).  A needle will be injected through your skin into the epidural space.  Your health care provider will use an X-ray machine to ensure that the steroid is delivered closest to the affected nerve. You may have minimal discomfort at this time.  Once the needle is in the right position, the local anesthetic and the steroid will be injected into the epidural space.  The needle will then be removed and a bandage will be applied to the injection site. AFTER THE PROCEDURE  12. You may be monitored for a short time before you go home. 13. You may feel weakness or numbness in your arm or leg, which disappears within hours. 14. You may be allowed to eat, drink, and take your regular medicine. 15. You may have soreness at the site of the injection.   This information is not intended to replace advice given to you by your health care provider. Make sure you discuss any questions you have with your health care provider.   Document Released: 05/07/2007 Document Revised: 09/30/2012 Document Reviewed: 07/17/2012 Elsevier Interactive Patient Education 2016 [place]. Pain Management Discharge Instructions  General Discharge Instructions :  If you need to reach your doctor call: Monday-Friday 8:00 am - 4:00 pm at [phone] or toll free [phone].  After clinic hours [phone] to have operator reach doctor.  Bring  all of your medication bottles to all your appointments in the pain clinic.  To cancel or reschedule your appointment with Pain Management please remember to call 24 hours in advance to avoid a fee.  Refer to the educational materials which you have been given on: General Risks, I had my Procedure. Discharge Instructions, Post Sedation.  Post Procedure Instructions:  The drugs you were given will stay in your system until tomorrow, so for the next 24 hours you should not drive, make any legal  decisions or drink any alcoholic beverages.  You may eat anything you prefer, but it is better to start with liquids then soups and crackers, and gradually work up to solid foods.  Please notify your doctor immediately if you have any unusual bleeding, trouble breathing or pain that is not related to your normal pain.  Depending on the type of procedure that was done, some parts of your body may feel week and/or numb.  This usually clears up by tonight or the next day.  Walk with the use of an assistive device or accompanied by an adult for the 24 hours.  You may use ice on the affected area for the first 24 hours.  Put ice in a Ziploc bag and cover with a towel and place against area 15 minutes on 15 minutes off.  You may switch to heat after 24 hours.

## 2014-11-23 NOTE — Progress Notes (Signed)
   Subjective:    Patient ID: Meagan Bridges, female    DOB: [birth year], 49 y.o.   MRN: [id number]  HPI PROCEDURE PERFORMED: Lumbar epidural steroid injection   NOTE: The patient is a 49 y.o. female who returns to [facility] for further evaluation and treatment of pain involving the lumbar and lower extremity region.  MRI revealed the patient to be with degenerative changes lumbar spine  withL4-L5 disc protrusion, with narrowing of the left neural foramen at this level, L4-5 annular disc bulging and left paracentral disc protrusion.. The risks, benefits, and expectations of the procedure have been discussed and explained to the patient who was understanding and in agreement with suggested treatment plan. We will proceed with lumbar epidural steroid injection as discussed and as explained to the patient who is willing to proceed with procedure as planned.   DESCRIPTION OF PROCEDURE: Lumbar epidural steroid injection with IV Versed, IV fentanyl conscious sedation, EKG, blood pressure, pulse, and pulse oximetry monitoring. The procedure was performed with the patient in the prone position under fluoroscopic guidance. A local anesthetic skin wheal of 1.5% plain lidocaine was accomplished at proposed entry site. An 18-gauge Tuohy epidural needle was inserted at the L  4 vertebral body level  right of the midline via loss-of-resistance technique with negative heme and negative CSF return. A total of 4 mL of Preservative-Free normal saline with 40 mg of Kenalog injected incrementally via epidurally placed needle. Needle was removed.  Myoneural block injections of the lumbar paraspinal musculature region Following Betadine prep proposed entry site a 22-gauge needle was inserted in the lumbar paraspinal musculature region and following negative aspiration 2 cc of 0.25% bupivacaine with Norflex was injected for myoneural block injection 4   A total of 40 mg of Kenalog was utilized for the  procedure.   The patient tolerated the injection well.    PLAN:   1. Medications: We will continue presently prescribed medications.  Flexeril and hydrocodone acetaminophen 2. Will consider modification of treatment regimen pending response to treatment rendered on today's visit and follow-up evaluation. 3. The patient is to follow-up with primary care physician  Dr. Lisette Grinder III regarding blood pressure and general medical condition status post lumbar epidural steroid injection performed on today's visit. 4. Surgical evaluation. May consider as discussed 5. Neurological evaluation  May consider [facility] EMG studies and other studies pending follow-up evaluation 6. The patient may be a candidate for radiofrequency procedures, implantation device, and other treatment pending response to treatment and follow-up evaluation. 7. The patient has been advised to adhere to proper body mechanics and avoid activities which appear to aggravate condition. 8. The patient has been advised to call the Pain Management Center prior to scheduled return appointment should there be significant change in condition or should there be sign  The patient is understanding and agrees with the suggested  treatment plan    Review of Systems     Objective:   Physical Exam        Assessment & Plan:

## 2014-11-24 ENCOUNTER — Telehealth: Payer: Self-pay | Admitting: *Deleted

## 2014-11-24 NOTE — Telephone Encounter (Signed)
No problems post procedure phone call. 

## 2014-12-06 ENCOUNTER — Other Ambulatory Visit: Payer: Self-pay | Admitting: Pain Medicine

## 2014-12-22 ENCOUNTER — Encounter: Payer: Self-pay | Admitting: Pain Medicine

## 2014-12-22 ENCOUNTER — Ambulatory Visit: Payer: BLUE CROSS/BLUE SHIELD | Attending: Pain Medicine | Admitting: Pain Medicine

## 2014-12-22 VITALS — BP 120/77 | HR 97 | Temp 97.5°F | Resp 18 | Ht 63.0 in | Wt 170.0 lb

## 2014-12-22 DIAGNOSIS — M533 Sacrococcygeal disorders, not elsewhere classified: Secondary | ICD-10-CM | POA: Insufficient documentation

## 2014-12-22 DIAGNOSIS — M51369 Other intervertebral disc degeneration, lumbar region without mention of lumbar back pain or lower extremity pain: Secondary | ICD-10-CM

## 2014-12-22 DIAGNOSIS — M5136 Other intervertebral disc degeneration, lumbar region: Secondary | ICD-10-CM

## 2014-12-22 DIAGNOSIS — M461 Sacroiliitis, not elsewhere classified: Secondary | ICD-10-CM

## 2014-12-22 DIAGNOSIS — M5416 Radiculopathy, lumbar region: Secondary | ICD-10-CM | POA: Diagnosis not present

## 2014-12-22 DIAGNOSIS — M47818 Spondylosis without myelopathy or radiculopathy, sacral and sacrococcygeal region: Secondary | ICD-10-CM

## 2014-12-22 DIAGNOSIS — M5126 Other intervertebral disc displacement, lumbar region: Secondary | ICD-10-CM | POA: Diagnosis not present

## 2014-12-22 DIAGNOSIS — M5481 Occipital neuralgia: Secondary | ICD-10-CM | POA: Diagnosis not present

## 2014-12-22 DIAGNOSIS — M545 Low back pain: Secondary | ICD-10-CM | POA: Diagnosis present

## 2014-12-22 DIAGNOSIS — M47816 Spondylosis without myelopathy or radiculopathy, lumbar region: Secondary | ICD-10-CM

## 2014-12-22 DIAGNOSIS — M7060 Trochanteric bursitis, unspecified hip: Secondary | ICD-10-CM | POA: Diagnosis not present

## 2014-12-22 DIAGNOSIS — M79605 Pain in left leg: Secondary | ICD-10-CM | POA: Diagnosis present

## 2014-12-22 DIAGNOSIS — M79604 Pain in right leg: Secondary | ICD-10-CM | POA: Diagnosis present

## 2014-12-22 MED ORDER — HYDROCODONE-ACETAMINOPHEN 5-325 MG PO TABS: ORAL_TABLET | ORAL | Status: DC

## 2014-12-22 MED ORDER — CYCLOBENZAPRINE HCL 10 MG PO TABS: ORAL_TABLET | ORAL | Status: DC

## 2014-12-22 NOTE — Progress Notes (Signed)
   Subjective:    Patient ID: Meagan Bridges, female    DOB: [birth year], 49 y.o.   MRN: [id number]  HPI  The patient is a 49 year old female who returns to pain management evaluation and treatment of pain involving the region of the back and lower extremity regions. The patient has had significant improvement in her pain with prior treatment consisting of lumbar epidural steroid injection. At the present time patient states pain is aggravated by standing and walking may become more intense as patient spends more time on the feet. Patient denies any recent trauma change in events of daily living. We will discuss repeating lumbar epidural steroid injection and patient will inform us when she wishes to proceed with the second Lumbar epidural steroid injection. We will continue Flexeril and hydrocodone at this time as prescribed. He'll also discussed surgical evaluation. The patient agrees to suggested treatment plan     Review of Systems     Objective:   Physical Exam  There was tenderness to palpation of the splenius capitis and the symptoms regions of mild degree. There was mild tenderness of the acromioclavicular and glenohumeral joint regions. The patient appeared to be with bilaterally equal grip strength and Tinel and Phalen's maneuver without increased pain of significant degree palpation over the lumbar paraspinal muscles lumbar facet region reproduced moderate discomfort. Lateral bending and rotation extension and palpation of the lumbar facets reproduced moderately severe discomfort. Straight leg raising is limited to approximately 20 without a definite increased pain with dorsiflexion noted. There was questionably decreased sensation of the L5 dermatomal distribution. DTRs were difficult to elicit. It was negative clonus negative Homans. Abdomen was nontender with no costovertebral tenderness noted.        Assessment & Plan:     Degenerative changes lumbar spine  L4-L5 disc  protrusion, with narrowing of the left neural foramen at this level, L4-5 annular disc bulging and left paracentral disc protrusion.   Lumbar facet syndrome   Lumbar radiculopathy   sacroiliac joint dysfunction  Greater trochanteric bursitis   Greater occipital neuralgia   PLAN  Continue present medication Flexeril and hydrocodone acetaminophen  Will consider lumbar epidural steroid injection. Please let us know when you wish to proceed with procedure  F/U PCP Dr. Lisette Grinder III for evaliation of  BP and general medical  condition  F/U surgical evaluation. Ask receptionist date of neurosurgical evaluation  F/U neurological evaluation. May consider pending follow-up evaluations  F/U Dr.Reddy as discussed   May consider radiofrequency rhizolysis or intraspinal procedures pending response to present treatment and F/U evaluation  Patient to call Pain Management Center should patient have concerns prior to scheduled return appointment

## 2014-12-22 NOTE — Progress Notes (Signed)
Safety precautions to be maintained throughout the outpatient stay will include: orient to surroundings, keep bed in low position, maintain call bell within reach at all times, provide assistance with transfer out of bed and ambulation.  

## 2014-12-22 NOTE — Patient Instructions (Addendum)
PLAN   Continue present medication Flexeril and hydrocodone acetaminophen  Will consider lumbar epidural steroid injection. Please let us know when you wish to proceed with procedure  F/U PCP Dr. Lisette Grinder III for evaliation of  BP and general medical  condition  F/U surgical evaluation. Ask receptionist date of neurosurgical evaluation  F/U neurological evaluation. May consider pending follow-up evaluations  F/U Dr.Reddy as discussed   May consider radiofrequency rhizolysis or intraspinal procedures pending response to present treatment and F/U evaluation   Patient to call [facility] should patient have concerns prior to scheduled return appointment.Pain Management Discharge Instructions  General Discharge Instructions :  If you need to reach your doctor call: Monday-Friday 8:00 am - 4:00 pm at [phone] or toll free [phone].  After clinic hours [phone] to have operator reach doctor.  Bring all of your medication bottles to all your appointments in the pain clinic.  To cancel or reschedule your appointment with Pain Management please remember to call 24 hours in advance to avoid a fee.  Refer to the educational materials which you have been given on: General Risks, I had my Procedure. Discharge Instructions, Post Sedation.  Post Procedure Instructions:  The drugs you were given will stay in your system until tomorrow, so for the next 24 hours you should not drive, make any legal decisions or drink any alcoholic beverages.  You may eat anything you prefer, but it is better to start with liquids then soups and crackers, and gradually work up to solid foods.  Please notify your doctor immediately if you have any unusual bleeding, trouble breathing or pain that is not related to your normal pain.  Depending on the type of procedure that was done, some parts of your body may feel week and/or numb.  This usually clears up by tonight or the next  day.  Walk with the use of an assistive device or accompanied by an adult for the 24 hours.  You may use ice on the affected area for the first 24 hours.  Put ice in a Ziploc bag and cover with a towel and place against area 15 minutes on 15 minutes off.  You may switch to heat after 24 hours.GENERAL RISKS AND COMPLICATIONS  What are the risk, side effects and possible complications? Generally speaking, most procedures are safe.  However, with any procedure there are risks, side effects, and the possibility of complications.  The risks and complications are dependent upon the sites that are lesioned, or the type of nerve block to be performed.  The closer the procedure is to the spine, the more serious the risks are.  Great care is taken when placing the radio frequency needles, block needles or lesioning probes, but sometimes complications can occur. 1. Infection: Any time there is an injection through the skin, there is a risk of infection.  This is why sterile conditions are used for these blocks.  There are four possible types of infection. 1. Localized skin infection. 2. Central Nervous System Infection-This can be in the form of Meningitis, which can be deadly. 3. Epidural Infections-This can be in the form of an epidural abscess, which can cause pressure inside of the spine, causing compression of the spinal cord with subsequent paralysis. This would require an emergency surgery to decompress, and there are no guarantees that the patient would recover from the paralysis. 4. Discitis-This is an infection of the intervertebral discs.  It occurs in about 1% of discography procedures.  It  is difficult to treat and it may lead to surgery.        2. Pain: the needles have to go through skin and soft tissues, will cause soreness.       3. Damage to internal structures:  The nerves to be lesioned may be near blood vessels or    other nerves which can be potentially damaged.       4. Bleeding:  Bleeding is more common if the patient is taking blood thinners such as  aspirin, Coumadin, Ticiid, Plavix, etc., or if he/she have some genetic predisposition  such as hemophilia. Bleeding into the spinal canal can cause compression of the spinal  cord with subsequent paralysis.  This would require an emergency surgery to  decompress and there are no guarantees that the patient would recover from the  paralysis.       5. Pneumothorax:  Puncturing of a lung is a possibility, every time a needle is introduced in  the area of the chest or upper back.  Pneumothorax refers to free air around the  collapsed lung(s), inside of the thoracic cavity (chest cavity).  Another two possible  complications related to a similar event would include: Hemothorax and Chylothorax.   These are variations of the Pneumothorax, where instead of air around the collapsed  lung(s), you may have blood or chyle, respectively.       6. Spinal headaches: They may occur with any procedures in the area of the spine.       7. Persistent CSF (Cerebro-Spinal Fluid) leakage: This is a rare problem, but may occur  with prolonged intrathecal or epidural catheters either due to the formation of a fistulous  track or a dural tear.       8. Nerve damage: By working so close to the spinal cord, there is always a possibility of  nerve damage, which could be as serious as a permanent spinal cord injury with  paralysis.       9. Death:  Although rare, severe deadly allergic reactions known as "Anaphylactic  reaction" can occur to any of the medications used.      10. Worsening of the symptoms:  We can always make thing worse.  What are the chances of something like this happening? Chances of any of this occuring are extremely low.  By statistics, you have more of a chance of getting killed in a motor vehicle accident: while driving to the hospital than any of the above occurring .  Nevertheless, you should be aware that they are possibilities.  In  general, it is similar to taking a shower.  Everybody knows that you can slip, hit your head and get killed.  Does that mean that you should not shower again?  Nevertheless always keep in mind that statistics do not mean anything if you happen to be on the wrong side of them.  Even if a procedure has a 1 (one) in a 1,000,000 (million) chance of going wrong, it you happen to be that one..Also, keep in mind that by statistics, you have more of a chance of having something go wrong when taking medications.  Who should not have this procedure? If you are on a blood thinning medication (e.g. Coumadin, Plavix, see list of "Blood Thinners"), or if you have an active infection going on, you should not have the procedure.  If you are taking any blood thinners, please inform your physician.  How should I prepare for this procedure?  Do  not eat or drink anything at least six hours prior to the procedure.  Bring a driver with you .  It cannot be a taxi.  Come accompanied by an adult that can drive you back, and that is strong enough to help you if your legs get weak or numb from the local anesthetic.  Take all of your medicines the morning of the procedure with just enough water to swallow them.  If you have diabetes, make sure that you are scheduled to have your procedure done first thing in the morning, whenever possible.  If you have diabetes, take only half of your insulin dose and notify our nurse that you have done so as soon as you arrive at the clinic.  If you are diabetic, but only take blood sugar pills (oral hypoglycemic), then do not take them on the morning of your procedure.  You may take them after you have had the procedure.  Do not take aspirin or any aspirin-containing medications, at least eleven (11) days prior to the procedure.  They may prolong bleeding.  Wear loose fitting clothing that may be easy to take off and that you would not mind if it got stained with Betadine or  blood.  Do not wear any jewelry or perfume  Remove any nail coloring.  It will interfere with some of our monitoring equipment.  NOTE: Remember that this is not meant to be interpreted as a complete list of all possible complications.  Unforeseen problems may occur.  BLOOD THINNERS The following drugs contain aspirin or other products, which can cause increased bleeding during surgery and should not be taken for 2 weeks prior to and 1 week after surgery.  If you should need take something for relief of minor pain, you may take acetaminophen which is found in Tylenol,m Datril, Anacin-3 and Panadol. It is not blood thinner. The products listed below are.  Do not take any of the products listed below in addition to any listed on your instruction sheet.  A.P.C or A.P.C with Codeine Codeine Phosphate Capsules #3 Ibuprofen Ridaura  ABC compound Congesprin Imuran rimadil  Advil Cope Indocin Robaxisal  Alka-Seltzer Effervescent Pain Reliever and Antacid Coricidin or Coricidin-D  Indomethacin Rufen  Alka-Seltzer plus Cold Medicine Cosprin Ketoprofen S-A-C Tablets  Anacin Analgesic Tablets or Capsules Coumadin Korlgesic Salflex  Anacin Extra Strength Analgesic tablets or capsules CP-2 Tablets Lanoril Salicylate  Anaprox Cuprimine Capsules Levenox Salocol  Anexsia-D Dalteparin Magan Salsalate  Anodynos Darvon compound Magnesium Salicylate Sine-off  Ansaid Dasin Capsules Magsal Sodium Salicylate  Anturane Depen Capsules Marnal Soma  APF Arthritis pain formula Dewitt's Pills Measurin Stanback  Argesic Dia-Gesic Meclofenamic Sulfinpyrazone  Arthritis Bayer Timed Release Aspirin Diclofenac Meclomen Sulindac  Arthritis pain formula Anacin Dicumarol Medipren Supac  Analgesic (Safety coated) Arthralgen Diffunasal Mefanamic Suprofen  Arthritis Strength Bufferin Dihydrocodeine Mepro Compound Suprol  Arthropan liquid Dopirydamole Methcarbomol with Aspirin Synalgos  ASA tablets/Enseals Disalcid Micrainin  Tagament  Ascriptin Doan's Midol Talwin  Ascriptin A/D Dolene Mobidin Tanderil  Ascriptin Extra Strength Dolobid Moblgesic Ticlid  Ascriptin with Codeine Doloprin or Doloprin with Codeine Momentum Tolectin  Asperbuf Duoprin Mono-gesic Trendar  Aspergum Duradyne Motrin or Motrin IB Triminicin  Aspirin plain, buffered or enteric coated Durasal Myochrisine Trigesic  Aspirin Suppositories Easprin Nalfon Trillsate  Aspirin with Codeine Ecotrin Regular or Extra Strength Naprosyn Uracel  Atromid-S Efficin Naproxen Ursinus  Auranofin Capsules Elmiron Neocylate Vanquish  Axotal Emagrin Norgesic Verin  Azathioprine Empirin or Empirin with Codeine Normiflo Vitamin E  Azolid Emprazil Nuprin Voltaren  Bayer Aspirin plain, buffered or children's or timed BC Tablets or powders Encaprin Orgaran Warfarin Sodium  Buff-a-Comp Enoxaparin Orudis Zorpin  Buff-a-Comp with Codeine Equegesic Os-Cal-Gesic   Buffaprin Excedrin plain, buffered or Extra Strength Oxalid   Bufferin Arthritis Strength Feldene Oxphenbutazone   Bufferin plain or Extra Strength Feldene Capsules Oxycodone with Aspirin   Bufferin with Codeine Fenoprofen Fenoprofen Pabalate or Pabalate-SF   Buffets II Flogesic Panagesic   Buffinol plain or Extra Strength Florinal or Florinal with Codeine Panwarfarin   Buf-Tabs Flurbiprofen Penicillamine   Butalbital Compound Four-way cold tablets Penicillin   Butazolidin Fragmin Pepto-Bismol   Carbenicillin Geminisyn Percodan   Carna Arthritis Reliever Geopen Persantine   Carprofen Gold's salt Persistin   Chloramphenicol Goody's Phenylbutazone   Chloromycetin Haltrain Piroxlcam   Clmetidine heparin Plaquenil   Cllnoril Hyco-pap Ponstel   Clofibrate Hydroxy chloroquine Propoxyphen         Before stopping any of these medications, be sure to consult the physician who ordered them.  Some, such as Coumadin (Warfarin) are ordered to prevent or treat serious conditions such as "deep thrombosis", "pumonary  embolisms", and other heart problems.  The amount of time that you may need off of the medication may also vary with the medication and the reason for which you were taking it.  If you are taking any of these medications, please make sure you notify your pain physician before you undergo any procedures.         Epidural Steroid Injection An epidural steroid injection is given to relieve pain in your neck, back, or legs that is caused by the irritation or swelling of a nerve root. This procedure involves injecting a steroid and numbing medicine (anesthetic) into the epidural space. The epidural space is the space between the outer covering of your spinal cord and the bones that form your backbone (vertebra).  LET [facility] CARE PROVIDER KNOW ABOUT:  2. Any allergies you have. 3. All medicines you are taking, including vitamins, herbs, eye drops, creams, and over-the-counter medicines such as aspirin. 4. Previous problems you or members of your family have had with the use of anesthetics. 5. Any blood disorders or blood clotting disorders you have. 6. Previous surgeries you have had. 7. Medical conditions you have. RISKS AND COMPLICATIONS Generally, this is a safe procedure. However, as with any procedure, complications can occur. Possible complications of epidural steroid injection include:  Headache.  Bleeding.  Infection.  Allergic reaction to the medicines.  Damage to your nerves. The response to this procedure depends on the underlying cause of the pain and its duration. People who have long-term (chronic) pain are less likely to benefit from epidural steroids than are those people whose pain comes on strong and suddenly. BEFORE THE PROCEDURE   Ask your health care provider about changing or stopping your regular medicines. You may be advised to stop taking blood-thinning medicines a few days before the procedure.  You may be given medicines to reduce anxiety.  Arrange for  someone to take you home after the procedure. PROCEDURE   You will remain awake during the procedure. You may receive medicine to make you relaxed.  You will be asked to lie on your stomach.  The injection site will be cleaned.  The injection site will be numbed with a medicine (local anesthetic).  A needle will be injected through your skin into the epidural space.  Your health care provider will use an X-ray machine to ensure  that the steroid is delivered closest to the affected nerve. You may have minimal discomfort at this time.  Once the needle is in the right position, the local anesthetic and the steroid will be injected into the epidural space.  The needle will then be removed and a bandage will be applied to the injection site. AFTER THE PROCEDURE  12. You may be monitored for a short time before you go home. 13. You may feel weakness or numbness in your arm or leg, which disappears within hours. 14. You may be allowed to eat, drink, and take your regular medicine. 15. You may have soreness at the site of the injection.   This information is not intended to replace advice given to you by your health care provider. Make sure you discuss any questions you have with your health care provider.   Document Released: 05/07/2007 Document Revised: 09/30/2012 Document Reviewed: 07/17/2012 Elsevier Interactive Patient Education [employer].   Stop taking the  Aspirin  5 days  before  procedure

## 2015-01-19 ENCOUNTER — Ambulatory Visit: Payer: BC Managed Care – PPO | Attending: Pain Medicine | Admitting: Pain Medicine

## 2015-01-19 ENCOUNTER — Encounter: Payer: Self-pay | Admitting: Pain Medicine

## 2015-01-19 VITALS — BP 115/74 | HR 101 | Temp 96.5°F | Resp 15 | Ht 63.0 in | Wt 170.0 lb

## 2015-01-19 DIAGNOSIS — M7060 Trochanteric bursitis, unspecified hip: Secondary | ICD-10-CM | POA: Insufficient documentation

## 2015-01-19 DIAGNOSIS — M545 Low back pain: Secondary | ICD-10-CM | POA: Diagnosis present

## 2015-01-19 DIAGNOSIS — M5126 Other intervertebral disc displacement, lumbar region: Secondary | ICD-10-CM | POA: Diagnosis not present

## 2015-01-19 DIAGNOSIS — M79604 Pain in right leg: Secondary | ICD-10-CM | POA: Diagnosis present

## 2015-01-19 DIAGNOSIS — M47818 Spondylosis without myelopathy or radiculopathy, sacral and sacrococcygeal region: Secondary | ICD-10-CM

## 2015-01-19 DIAGNOSIS — M5136 Other intervertebral disc degeneration, lumbar region: Secondary | ICD-10-CM

## 2015-01-19 DIAGNOSIS — M47816 Spondylosis without myelopathy or radiculopathy, lumbar region: Secondary | ICD-10-CM

## 2015-01-19 DIAGNOSIS — M533 Sacrococcygeal disorders, not elsewhere classified: Secondary | ICD-10-CM | POA: Insufficient documentation

## 2015-01-19 DIAGNOSIS — M5481 Occipital neuralgia: Secondary | ICD-10-CM | POA: Insufficient documentation

## 2015-01-19 DIAGNOSIS — M79605 Pain in left leg: Secondary | ICD-10-CM | POA: Diagnosis present

## 2015-01-19 DIAGNOSIS — M5416 Radiculopathy, lumbar region: Secondary | ICD-10-CM

## 2015-01-19 DIAGNOSIS — M461 Sacroiliitis, not elsewhere classified: Secondary | ICD-10-CM

## 2015-01-19 DIAGNOSIS — M5116 Intervertebral disc disorders with radiculopathy, lumbar region: Secondary | ICD-10-CM | POA: Diagnosis not present

## 2015-01-19 MED ORDER — HYDROCODONE-ACETAMINOPHEN 5-325 MG PO TABS: ORAL_TABLET | ORAL | Status: DC

## 2015-01-19 NOTE — Patient Instructions (Addendum)
PLAN   Continue present medication Flexeril and hydrocodone acetaminophen  Lumbar epidural steroid injection to be performed at time return appointment  F/U PCP Dr. Lisette Grinder III for evaliation of  BP and general medical  condition  F/U surgical evaluation. Ask receptionist date of neurosurgical evaluation  F/U neurological evaluation. May consider pending follow-up evaluations  F/U Dr.Reddy as discussed   May consider radiofrequency rhizolysis or intraspinal procedures pending response to present treatment and F/U evaluation   Patient to call [facility] should patient have concerns prior to scheduled return appointment.Epidural Steroid Injection Patient Information  Description: The epidural space surrounds the nerves as they exit the spinal cord.  In some patients, the nerves can be compressed and inflamed by a bulging disc or a tight spinal canal (spinal stenosis).  By injecting steroids into the epidural space, we can bring irritated nerves into direct contact with a potentially helpful medication.  These steroids act directly on the irritated nerves and can reduce swelling and inflammation which often leads to decreased pain.  Epidural steroids may be injected anywhere along the spine and from the neck to the low back depending upon the location of your pain.   After numbing the skin with local anesthetic (like Novocaine), a small needle is passed into the epidural space slowly.  You may experience a sensation of pressure while this is being done.  The entire block usually last less than 10 minutes.  Conditions which may be treated by epidural steroids:   Low back and leg pain  Neck and arm pain  Spinal stenosis  Post-laminectomy syndrome  Herpes zoster (shingles) pain  Pain from compression fractures  Preparation for the injection:  1. Do not eat any solid food or dairy products within 6 hours of your appointment.  2. You may drink clear liquids up to 2  hours before appointment.  Clear liquids include water, black coffee, juice or soda.  No milk or cream please. 3. You may take your regular medication, including pain medications, with a sip of water before your appointment  Diabetics should hold regular insulin (if taken separately) and take 1/2 normal NPH dos the morning of the procedure.  Carry some sugar containing items with you to your appointment. 4. A driver must accompany you and be prepared to drive you home after your procedure.  5. Bring all your current medications with your. 6. An IV may be inserted and sedation may be given at the discretion of the physician.   7. A blood pressure cuff, EKG and other monitors will often be applied during the procedure.  Some patients may need to have extra oxygen administered for a short period. 8. You will be asked to provide medical information, including your allergies, prior to the procedure.  We must know immediately if you are taking blood thinners (like Coumadin/Warfarin)  Or if you are allergic to IV iodine contrast (dye). We must know if you could possible be pregnant.  Possible side-effects:  Bleeding from needle site  Infection (rare, may require surgery)  Nerve injury (rare)  Numbness & tingling (temporary)  Difficulty urinating (rare, temporary)  Spinal headache ( a headache worse with upright posture)  Light -headedness (temporary)  Pain at injection site (several days)  Decreased blood pressure (temporary)  Weakness in arm/leg (temporary)  Pressure sensation in back/neck (temporary)  Call if you experience:  Fever/chills associated with headache or increased back/neck pain.  Headache worsened by an upright position.  New onset weakness or  numbness of an extremity below the injection site  Hives or difficulty breathing (go to the emergency room)  Inflammation or drainage at the infection site  Severe back/neck pain  Any new symptoms which are concerning to  you  Please note:  Although the local anesthetic injected can often make your back or neck feel good for several hours after the injection, the pain will likely return.  It takes 3-7 days for steroids to work in the epidural space.  You may not notice any pain relief for at least that one week.  If effective, we will often do a series of three injections spaced 3-6 weeks apart to maximally decrease your pain.  After the initial series, we generally will wait several months before considering a repeat injection of the same type.  If you have any questions, please call [phone] [place]  What are the risk, side effects and possible complications? Generally speaking, most procedures are safe.  However, with any procedure there are risks, side effects, and the possibility of complications.  The risks and complications are dependent upon the sites that are lesioned, or the type of nerve block to be performed.  The closer the procedure is to the spine, the more serious the risks are.  Great care is taken when placing the radio frequency needles, block needles or lesioning probes, but sometimes complications can occur. 1. Infection: Any time there is an injection through the skin, there is a risk of infection.  This is why sterile conditions are used for these blocks.  There are four possible types of infection. 1. Localized skin infection. 2. Central Nervous System Infection-This can be in the form of Meningitis, which can be deadly. 3. Epidural Infections-This can be in the form of an epidural abscess, which can cause pressure inside of the spine, causing compression of the spinal cord with subsequent paralysis. This would require an emergency surgery to decompress, and there are no guarantees that the patient would recover from the paralysis. 4. Discitis-This is an infection of the intervertebral discs.  It occurs in about 1% of  discography procedures.  It is difficult to treat and it may lead to surgery.        2. Pain: the needles have to go through skin and soft tissues, will cause soreness.       3. Damage to internal structures:  The nerves to be lesioned may be near blood vessels or    other nerves which can be potentially damaged.       4. Bleeding: Bleeding is more common if the patient is taking blood thinners such as  aspirin, Coumadin, Ticiid, Plavix, etc., or if he/she have some genetic predisposition  such as hemophilia. Bleeding into the spinal canal can cause compression of the spinal  cord with subsequent paralysis.  This would require an emergency surgery to  decompress and there are no guarantees that the patient would recover from the  paralysis.       5. Pneumothorax:  Puncturing of a lung is a possibility, every time a needle is introduced in  the area of the chest or upper back.  Pneumothorax refers to free air around the  collapsed lung(s), inside of the thoracic cavity (chest cavity).  Another two possible  complications related to a similar event would include: Hemothorax and Chylothorax.   These are variations of the Pneumothorax, where instead of air around the collapsed  lung(s), you may  have blood or chyle, respectively.       6. Spinal headaches: They may occur with any procedures in the area of the spine.       7. Persistent CSF (Cerebro-Spinal Fluid) leakage: This is a rare problem, but may occur  with prolonged intrathecal or epidural catheters either due to the formation of a fistulous  track or a dural tear.       8. Nerve damage: By working so close to the spinal cord, there is always a possibility of  nerve damage, which could be as serious as a permanent spinal cord injury with  paralysis.       9. Death:  Although rare, severe deadly allergic reactions known as "Anaphylactic  reaction" can occur to any of the medications used.      10. Worsening of the symptoms:  We can always make thing  worse.  What are the chances of something like this happening? Chances of any of this occuring are extremely low.  By statistics, you have more of a chance of getting killed in a motor vehicle accident: while driving to the hospital than any of the above occurring .  Nevertheless, you should be aware that they are possibilities.  In general, it is similar to taking a shower.  Everybody knows that you can slip, hit your head and get killed.  Does that mean that you should not shower again?  Nevertheless always keep in mind that statistics do not mean anything if you happen to be on the wrong side of them.  Even if a procedure has a 1 (one) in a 1,000,000 (million) chance of going wrong, it you happen to be that one..Also, keep in mind that by statistics, you have more of a chance of having something go wrong when taking medications.  Who should not have this procedure? If you are on a blood thinning medication (e.g. Coumadin, Plavix, see list of "Blood Thinners"), or if you have an active infection going on, you should not have the procedure.  If you are taking any blood thinners, please inform your physician.  How should I prepare for this procedure?  Do not eat or drink anything at least six hours prior to the procedure.  Bring a driver with you .  It cannot be a taxi.  Come accompanied by an adult that can drive you back, and that is strong enough to help you if your legs get weak or numb from the local anesthetic.  Take all of your medicines the morning of the procedure with just enough water to swallow them.  If you have diabetes, make sure that you are scheduled to have your procedure done first thing in the morning, whenever possible.  If you have diabetes, take only half of your insulin dose and notify our nurse that you have done so as soon as you arrive at the clinic.  If you are diabetic, but only take blood sugar pills (oral hypoglycemic), then do not take them on the morning of your  procedure.  You may take them after you have had the procedure.  Do not take aspirin or any aspirin-containing medications, at least eleven (11) days prior to the procedure.  They may prolong bleeding.  Wear loose fitting clothing that may be easy to take off and that you would not mind if it got stained with Betadine or blood.  Do not wear any jewelry or perfume  Remove any nail coloring.  It will interfere with  some of our monitoring equipment.  NOTE: Remember that this is not meant to be interpreted as a complete list of all possible complications.  Unforeseen problems may occur.  BLOOD THINNERS The following drugs contain aspirin or other products, which can cause increased bleeding during surgery and should not be taken for 2 weeks prior to and 1 week after surgery.  If you should need take something for relief of minor pain, you may take acetaminophen which is found in Tylenol,m Datril, Anacin-3 and Panadol. It is not blood thinner. The products listed below are.  Do not take any of the products listed below in addition to any listed on your instruction sheet.  A.P.C or A.P.C with Codeine Codeine Phosphate Capsules #3 Ibuprofen Ridaura  ABC compound Congesprin Imuran rimadil  Advil Cope Indocin Robaxisal  Alka-Seltzer Effervescent Pain Reliever and Antacid Coricidin or Coricidin-D  Indomethacin Rufen  Alka-Seltzer plus Cold Medicine Cosprin Ketoprofen S-A-C Tablets  Anacin Analgesic Tablets or Capsules Coumadin Korlgesic Salflex  Anacin Extra Strength Analgesic tablets or capsules CP-2 Tablets Lanoril Salicylate  Anaprox Cuprimine Capsules Levenox Salocol  Anexsia-D Dalteparin Magan Salsalate  Anodynos Darvon compound Magnesium Salicylate Sine-off  Ansaid Dasin Capsules Magsal Sodium Salicylate  Anturane Depen Capsules Marnal Soma  APF Arthritis pain formula Dewitt's Pills Measurin Stanback  Argesic Dia-Gesic Meclofenamic Sulfinpyrazone  Arthritis Bayer Timed Release Aspirin  Diclofenac Meclomen Sulindac  Arthritis pain formula Anacin Dicumarol Medipren Supac  Analgesic (Safety coated) Arthralgen Diffunasal Mefanamic Suprofen  Arthritis Strength Bufferin Dihydrocodeine Mepro Compound Suprol  Arthropan liquid Dopirydamole Methcarbomol with Aspirin Synalgos  ASA tablets/Enseals Disalcid Micrainin Tagament  Ascriptin Doan's Midol Talwin  Ascriptin A/D Dolene Mobidin Tanderil  Ascriptin Extra Strength Dolobid Moblgesic Ticlid  Ascriptin with Codeine Doloprin or Doloprin with Codeine Momentum Tolectin  Asperbuf Duoprin Mono-gesic Trendar  Aspergum Duradyne Motrin or Motrin IB Triminicin  Aspirin plain, buffered or enteric coated Durasal Myochrisine Trigesic  Aspirin Suppositories Easprin Nalfon Trillsate  Aspirin with Codeine Ecotrin Regular or Extra Strength Naprosyn Uracel  Atromid-S Efficin Naproxen Ursinus  Auranofin Capsules Elmiron Neocylate Vanquish  Axotal Emagrin Norgesic Verin  Azathioprine Empirin or Empirin with Codeine Normiflo Vitamin E  Azolid Emprazil Nuprin Voltaren  Bayer Aspirin plain, buffered or children's or timed BC Tablets or powders Encaprin Orgaran Warfarin Sodium  Buff-a-Comp Enoxaparin Orudis Zorpin  Buff-a-Comp with Codeine Equegesic Os-Cal-Gesic   Buffaprin Excedrin plain, buffered or Extra Strength Oxalid   Bufferin Arthritis Strength Feldene Oxphenbutazone   Bufferin plain or Extra Strength Feldene Capsules Oxycodone with Aspirin   Bufferin with Codeine Fenoprofen Fenoprofen Pabalate or Pabalate-SF   Buffets II Flogesic Panagesic   Buffinol plain or Extra Strength Florinal or Florinal with Codeine Panwarfarin   Buf-Tabs Flurbiprofen Penicillamine   Butalbital Compound Four-way cold tablets Penicillin   Butazolidin Fragmin Pepto-Bismol   Carbenicillin Geminisyn Percodan   Carna Arthritis Reliever Geopen Persantine   Carprofen Gold's salt Persistin   Chloramphenicol Goody's Phenylbutazone   Chloromycetin Haltrain Piroxlcam    Clmetidine heparin Plaquenil   Cllnoril Hyco-pap Ponstel   Clofibrate Hydroxy chloroquine Propoxyphen         Before stopping any of these medications, be sure to consult the physician who ordered them.  Some, such as Coumadin (Warfarin) are ordered to prevent or treat serious conditions such as "deep thrombosis", "pumonary embolisms", and other heart problems.  The amount of time that you may need off of the medication may also vary with the medication and the reason for which you were taking it.  If you are taking any of these medications, please make sure you notify your pain physician before you undergo any procedures.

## 2015-01-19 NOTE — Progress Notes (Signed)
Safety precautions to be maintained throughout the outpatient stay will include: orient to surroundings, keep bed in low position, maintain call bell within reach at all times, provide assistance with transfer out of bed and ambulation.  

## 2015-01-19 NOTE — Progress Notes (Signed)
   Subjective:    Patient ID: Meagan Bridges, female    DOB: 1965-12-21, 49 y.o.   MRN: [id number]  HPI  Patient is a 49 year old female who returns to pain management for further evaluation and treatment of pain involving the lower back and lower extremity regions. Patient states that the pain radiates from the lumbar region of the lower extremity region on the right as well as on the left. Patient is without recent trauma change in events of daily living the call significant change in symptomatology. Patient states the pain becomes more intense as the day progresses and is associated with some lower extremity weakness as well . The patient states that the pain radiates to the toes as well. The patient is experiencing some numbness of the toes. We discussed patient's condition and will proceed with lumbar epidural steroid injection at time return appointment in attempt to decrease severity of symptoms, minimize progression of symptoms, and avoid need for more involved treatment. The patient agreed to suggested treatment plan. The patient will continue present medications including hydrocodone acetaminophen and Flexeril. We will consider additional modifications of treatment pending follow-up evaluation     Review of Systems     Objective:   Physical Exam there was tenderness to palpation of paraspinal musculature region the cervical region cervical facet region palpation which reproduces pain of minimal degree there was mild to moderate tenderness to palpation of the splenius capitis and occipitalis musculature regions there was tenderness over the acromioclavicular and glenohumeral joint region. The patient was with bilaterally equal grip strength and Tinel and Phalen's maneuver were without increased pain of significant degree. Palpation over the thoracic region thoracic facet region was attends to palpation of mild degree with no crepitus of the thoracic region noted. Palpation over the lumbar  paraspinal musculature region lumbar facet region was associated with tenderness to palpation of moderate to moderately severe severe degree with tenderness over the PSIS and PII S region a moderate degree. Straight leg raising was tolerates approximately 20 without a definite increased pain with dorsiflexion noted. DTRs were trace at the knees and there was decreased EHL strength. There was questionably decreased sensation of the L5 dermatomal distribution. There was negative clonus negative Homans. Abdomen nontender with no costovertebral angle tenderness noted.      Assessment & Plan:     Degenerative changes lumbar spine  L4-L5 disc protrusion, with narrowing of the left neural foramen at this level, L4-5 annular disc bulging and left paracentral disc protrusion.   Lumbar facet syndrome   Lumbar radiculopathy   sacroiliac joint dysfunction  Greater trochanteric bursitis   Greater occipital neuralgia     PLAN  Continue present medication Flexeril and hydrocodone acetaminophen  Lumbar epidural steroid injection to be performed at time return appointment  F/U PCP Dr. Lisette Grinder III for evaliation of  BP and general medical  condition  F/U surgical evaluation. Ask receptionist date of neurosurgical evaluation  F/U neurological evaluation. May consider pending follow-up evaluations  F/U Dr.Reddy as discussed   May consider radiofrequency rhizolysis or intraspinal procedures pending response to present treatment and F/U evaluation   Patient to call [facility] Center should patient have concerns prior to scheduled return appointment.

## 2015-02-07 ENCOUNTER — Other Ambulatory Visit: Payer: Self-pay | Admitting: Pain Medicine

## 2015-02-08 ENCOUNTER — Encounter: Payer: Self-pay | Admitting: Pain Medicine

## 2015-02-08 ENCOUNTER — Ambulatory Visit: Payer: BLUE CROSS/BLUE SHIELD | Attending: Pain Medicine | Admitting: Pain Medicine

## 2015-02-08 DIAGNOSIS — M5481 Occipital neuralgia: Secondary | ICD-10-CM

## 2015-02-08 DIAGNOSIS — M5416 Radiculopathy, lumbar region: Secondary | ICD-10-CM

## 2015-02-08 DIAGNOSIS — M47816 Spondylosis without myelopathy or radiculopathy, lumbar region: Secondary | ICD-10-CM

## 2015-02-08 DIAGNOSIS — M461 Sacroiliitis, not elsewhere classified: Secondary | ICD-10-CM

## 2015-02-08 DIAGNOSIS — M47818 Spondylosis without myelopathy or radiculopathy, sacral and sacrococcygeal region: Secondary | ICD-10-CM

## 2015-02-08 DIAGNOSIS — M545 Low back pain: Secondary | ICD-10-CM | POA: Diagnosis present

## 2015-02-08 DIAGNOSIS — M5126 Other intervertebral disc displacement, lumbar region: Secondary | ICD-10-CM | POA: Diagnosis not present

## 2015-02-08 DIAGNOSIS — M5136 Other intervertebral disc degeneration, lumbar region: Secondary | ICD-10-CM | POA: Diagnosis not present

## 2015-02-08 DIAGNOSIS — M51369 Other intervertebral disc degeneration, lumbar region without mention of lumbar back pain or lower extremity pain: Secondary | ICD-10-CM

## 2015-02-08 DIAGNOSIS — M79606 Pain in leg, unspecified: Secondary | ICD-10-CM | POA: Diagnosis present

## 2015-02-08 MED ORDER — SODIUM CHLORIDE 0.9 % IJ SOLN
20.0000 mL | Freq: Once | INTRAMUSCULAR | Status: AC
  Administered 2015-02-08: 14:00:00

## 2015-02-08 MED ORDER — ORPHENADRINE CITRATE 30 MG/ML IJ SOLN
60.0000 mg | Freq: Once | INTRAMUSCULAR | Status: AC
  Administered 2015-02-08: 15:00:00 via INTRAMUSCULAR

## 2015-02-08 MED ORDER — LIDOCAINE HCL (PF) 1 % IJ SOLN
10.0000 mL | Freq: Once | INTRAMUSCULAR | Status: AC
Start: 2015-02-08 — End: 2015-02-08
  Administered 2015-02-08: 14:00:00 via SUBCUTANEOUS

## 2015-02-08 MED ORDER — HYDROCODONE-ACETAMINOPHEN 5-325 MG PO TABS: ORAL_TABLET | ORAL | Status: DC

## 2015-02-08 MED ORDER — ORPHENADRINE CITRATE 30 MG/ML IJ SOLN
INTRAMUSCULAR | Status: AC
  Filled 2015-02-08: qty 2

## 2015-02-08 MED ORDER — BUPIVACAINE HCL (PF) 0.25 % IJ SOLN
30.0000 mL | Freq: Once | INTRAMUSCULAR | Status: AC
Start: 2015-02-08 — End: 2015-02-08
  Administered 2015-02-08: 14:00:00

## 2015-02-08 MED ORDER — FENTANYL CITRATE (PF) 100 MCG/2ML IJ SOLN
INTRAMUSCULAR | Status: AC
  Administered 2015-02-08: 100 ug via INTRAVENOUS
  Filled 2015-02-08: qty 2

## 2015-02-08 MED ORDER — TRIAMCINOLONE ACETONIDE 40 MG/ML IJ SUSP
INTRAMUSCULAR | Status: AC
  Filled 2015-02-08: qty 1

## 2015-02-08 MED ORDER — SODIUM CHLORIDE 0.9 % IJ SOLN
INTRAMUSCULAR | Status: AC
  Filled 2015-02-08: qty 20

## 2015-02-08 MED ORDER — LIDOCAINE HCL (PF) 1 % IJ SOLN
INTRAMUSCULAR | Status: AC
  Filled 2015-02-08: qty 5

## 2015-02-08 MED ORDER — LACTATED RINGERS IV SOLN: 1000.0000 mL | INTRAVENOUS | Status: DC

## 2015-02-08 MED ORDER — FENTANYL CITRATE (PF) 100 MCG/2ML IJ SOLN
100.0000 ug | Freq: Once | INTRAMUSCULAR | Status: AC
  Administered 2015-02-08: 100 ug via INTRAVENOUS

## 2015-02-08 MED ORDER — MIDAZOLAM HCL 5 MG/5ML IJ SOLN
INTRAMUSCULAR | Status: AC
  Administered 2015-02-08: 3 mg via INTRAVENOUS
  Filled 2015-02-08: qty 5

## 2015-02-08 MED ORDER — MIDAZOLAM HCL 5 MG/5ML IJ SOLN
5.0000 mg | Freq: Once | INTRAMUSCULAR | Status: AC
  Administered 2015-02-08: 3 mg via INTRAVENOUS

## 2015-02-08 MED ORDER — TRIAMCINOLONE ACETONIDE 40 MG/ML IJ SUSP
40.0000 mg | Freq: Once | INTRAMUSCULAR | Status: AC
  Administered 2015-02-08: 14:00:00

## 2015-02-08 MED ORDER — BUPIVACAINE HCL (PF) 0.25 % IJ SOLN
INTRAMUSCULAR | Status: AC
  Filled 2015-02-08: qty 30

## 2015-02-08 MED ORDER — CYCLOBENZAPRINE HCL 10 MG PO TABS: ORAL_TABLET | ORAL | Status: DC

## 2015-02-08 NOTE — Progress Notes (Signed)
Safety precautions to be maintained throughout the outpatient stay will include: orient to surroundings, keep bed in low position, maintain call bell within reach at all times, provide assistance with transfer out of bed and ambulation.  

## 2015-02-08 NOTE — Progress Notes (Signed)
   Subjective:    Patient ID: Meagan Bridges, female    DOB: February 25, 1965, 49 y.o.   MRN: [id number]  HPI  PROCEDURE PERFORMED: Lumbar epidural steroid injection   NOTE: The patient is a 49 y.o. female who returns to [facility] for further evaluation and treatment of pain involving the lumbar and lower extremity region.  MRI revealed the patient to be with Degenerative changes lumbar spine  L4-L5 disc protrusion, with narrowing of the left neural foramen at this level, L4-5 annular disc bulging and left paracentral disc protrusion.. There is concern regarding intraspinal abnormalities contributing to patient's symptomatology with concern regarding component of lumbar radiculopathy and lumbar stenosis being factors contributing to patient's pain and paresthesias of the lumbar and lower extremity region. The risks, benefits, and expectations of the procedure have been discussed and explained to the patient who was understanding and in agreement with suggested treatment plan. We will proceed with lumbar epidural steroid injection as discussed and as explained to the patient who is willing to proceed with procedure as planned.   DESCRIPTION OF PROCEDURE: Lumbar epidural steroid injection with IV Versed, IV fentanyl conscious sedation, EKG, blood pressure, pulse, and pulse oximetry monitoring. The procedure was performed with the patient in the prone position under fluoroscopic guidance. A local anesthetic skin wheal of 1.5% plain lidocaine was accomplished at proposed entry site. An 18-gauge Tuohy epidural needle was inserted at the L  4 vertebral body level  right of the midline via loss-of-resistance technique with negative heme and negative CSF return. A total of 4 mL of Preservative-Free normal saline with 40 mg of Kenalog injected incrementally via epidurally placed needle. Needle was removed.   Myoneural block injections of the gluteal musculature region  Following Betadine prep of  proposed entry site a 22-gauge needle was inserted in the gluteal musculature region and following negative aspiration a total of 2 cc of 0.25% bupivacaine with Norflex was injected for myoneural block injection of the gluteal musculature region times two   The patient tolerated the procedure well   A total of 40 mg of Kenalog was utilized for the procedure.   The patient tolerated the injection well.    PLAN:   1. Medications: We will continue presently prescribed medications. Flexeril and hydrocodone acetaminophen 2. Will consider modification of treatment regimen pending response to treatment rendered on today's visit and follow-up evaluation. 3. The patient is to follow-up with primary care physician   Dr. Lisette Grinder III regarding blood pressure and general medical condition status post lumbar epidural steroid injection performed on today's visit. 4. Surgical evaluation. Has been addressed 5. Neurological evaluation. May consider [facility] EMG studies 6. The patient may be a candidate for radiofrequency procedures, implantation device, and other treatment pending response to treatment and follow-up evaluation. 7. The patient has been advised to adhere to proper body mechanics and avoid activities which appear to aggravate condition. 8. The patient has been advised to call the [facility] Management Center prior to scheduled return appointment should there be significant change in condition or should there be sign  The patient is understanding and agrees with the suggested  treatment plan   Review of Systems     Objective:   Physical Exam        Assessment & Plan:

## 2015-02-08 NOTE — Patient Instructions (Addendum)
PLAN   Continue present medication Flexeril and hydrocodone acetaminophen  F/U PCP  Dr. Lisette Grinder  III  for evaliation of  BP and general medical  condition  F/U surgical evaluation. May consider pending follow-up evaluations  F/U neurological evaluation. May consider pending follow-up evaluations  F/U Dr.Reddy as planned    May consider radiofrequency rhizolysis or intraspinal procedures pending response to present treatment and F/U evaluation   Patient to call Pain Management Center should patient have concerns prior to scheduled return appointmePATIENT INSTRUCTIONS POST-ANESTHESIA  IMMEDIATELY FOLLOWING SURGERY:  Do not drive or operate machinery for the first twenty four hours after surgery.  Do not make any important decisions for twenty four hours after surgery or while taking narcotic pain medications or sedatives.  If you develop intractable nausea and vomiting or a severe headache please notify your doctor immediately.  FOLLOW-UP:  Please make an appointment with your surgeon as instructed. You do not need to follow up with anesthesia unless specifically instructed to do so.  WOUND CARE INSTRUCTIONS (if applicable):  Keep a dry clean dressing on the anesthesia/puncture wound site if there is drainage.  Once the wound has quit draining you may leave it open to air.  Generally you should leave the bandage intact for twenty four hours unless there is drainage.  If the epidural site drains for more than 36-48 hours please call the anesthesia department.  QUESTIONS?:  Please feel free to call your physician or the hospital operator if you have any questions, and they will be happy to assist you.

## 2015-02-09 ENCOUNTER — Telehealth: Payer: Self-pay

## 2015-02-09 NOTE — Telephone Encounter (Signed)
States no problems with procedure yesterday

## 2015-03-16 ENCOUNTER — Encounter: Payer: BC Managed Care – PPO | Admitting: Pain Medicine

## 2015-03-20 ENCOUNTER — Encounter: Payer: Self-pay | Admitting: Pain Medicine

## 2015-03-20 ENCOUNTER — Ambulatory Visit: Payer: BC Managed Care – PPO | Attending: Pain Medicine | Admitting: Pain Medicine

## 2015-03-20 VITALS — BP 95/61 | HR 86 | Temp 97.4°F | Resp 14 | Ht 63.0 in | Wt 170.0 lb

## 2015-03-20 DIAGNOSIS — M47896 Other spondylosis, lumbar region: Secondary | ICD-10-CM | POA: Insufficient documentation

## 2015-03-20 DIAGNOSIS — M461 Sacroiliitis, not elsewhere classified: Secondary | ICD-10-CM

## 2015-03-20 DIAGNOSIS — M47816 Spondylosis without myelopathy or radiculopathy, lumbar region: Secondary | ICD-10-CM

## 2015-03-20 DIAGNOSIS — R51 Headache: Secondary | ICD-10-CM | POA: Diagnosis present

## 2015-03-20 DIAGNOSIS — M47818 Spondylosis without myelopathy or radiculopathy, sacral and sacrococcygeal region: Secondary | ICD-10-CM

## 2015-03-20 DIAGNOSIS — M533 Sacrococcygeal disorders, not elsewhere classified: Secondary | ICD-10-CM | POA: Diagnosis not present

## 2015-03-20 DIAGNOSIS — M7060 Trochanteric bursitis, unspecified hip: Secondary | ICD-10-CM | POA: Diagnosis not present

## 2015-03-20 DIAGNOSIS — M5481 Occipital neuralgia: Secondary | ICD-10-CM | POA: Insufficient documentation

## 2015-03-20 DIAGNOSIS — M542 Cervicalgia: Secondary | ICD-10-CM | POA: Diagnosis present

## 2015-03-20 DIAGNOSIS — M5126 Other intervertebral disc displacement, lumbar region: Secondary | ICD-10-CM | POA: Diagnosis not present

## 2015-03-20 DIAGNOSIS — M5416 Radiculopathy, lumbar region: Secondary | ICD-10-CM

## 2015-03-20 DIAGNOSIS — M5136 Other intervertebral disc degeneration, lumbar region: Secondary | ICD-10-CM

## 2015-03-20 MED ORDER — HYDROCODONE-ACETAMINOPHEN 5-325 MG PO TABS: ORAL_TABLET | ORAL | Status: DC

## 2015-03-20 NOTE — Patient Instructions (Signed)
PLAN   Continue present medication Flexeril and hydrocodone acetaminophen  F/U PCP Dr. Lisette Grinder III for evaliation of  BP and general medical  condition  F/U surgical evaluation. Ask receptionist date of neurosurgical evaluation  F/U neurological evaluation. May consider pending follow-up evaluations  F/U Dr.Reddy as discussed   May consider radiofrequency rhizolysis or intraspinal procedures pending response to present treatment and F/U evaluation   Patient to call Pain Management Center should patient have concerns prior to scheduled return appointment

## 2015-03-20 NOTE — Progress Notes (Signed)
Safety precautions to be maintained throughout the outpatient stay will include: orient to surroundings, keep bed in low position, maintain call bell within reach at all times, provide assistance with transfer out of bed and ambulation.  

## 2015-03-20 NOTE — Progress Notes (Signed)
Subjective:    Patient ID: Meagan Bridges, female    DOB: 1965-09-11, 50 y.o.   MRN: [id number]  HPI  The patient is a 50 year old female who returns to pain management Center for further evaluation and treatment of pain involving the neck headaches as well as entire back upper and lower extremity regions. The patient has returned to school and states that she has difficulty caring the buttocks. We discussed patient's condition on today's visit and have suggested that patient consider transporting her books via a pole pulling-type device which patient can pull behind her while she walks instead of carrying the buttocks on her back or in her arms. We discussed patient's condition including medications and will continue Flexeril and hydrocodone acetaminophen. The patient will call pain management should she wishes to proceed with interventional treatment and we will consider patient for such treatment as well as modification of medications and other aspects of treatment regimen. The patient was in agreement with suggested treatment plan. The patient denied any other significant changes and states that the pain occurs in the region of the upper back as well reciprocating headaches. We informed patient that the weight of the buttocks and other items which patient carries causes tension and increased spasm in the region of the trapezius levator scapula rhomboid musculature region which contributed to spasm and pain of the splenius capitis and occipitalis musculature regions precipitating headaches. We will consider patient for modification of treatment including interventional treatment pending follow-up evaluation. The patient agreed to suggested treatment plan and will call pain management should they be change in condition or should patient wished to proceed with interventional treatment or other modifications of treatment regimen. All agreed to suggested treatment plan       Review of Systems      Objective:   Physical Exam  There was tends to palpation of the splenius capitis and occipitalis musculature region. Palpation of these regions reproduced pain of moderate to moderately severe degree. There was tenderness of the trapezius levator scapula and rhomboid musculature region a moderate to moderately severe degree. The patient appeared to be with tenderness of the acromioclavicular and glenohumeral joint region a moderate degree. There appeared to be unremarkable Spurling's maneuver and palpation thoracic facet cervical facet cervical paraspinal misreading and thoracic paraspinal must reason reproduced pain of moderate discomfort. There was tenderness of the lumbar paraspinal must reason lumbar facet region with lateral bending rotation extension and palpation of the lumbar facets reproducing moderate to moderately severe discomfort. There was tenderness over the PSIS and PII S region as well as the gluteal and piriformis muscles regions. Straight leg raise was tolerates approximately 20 without increased pain with dorsiflexion noted. There was tenderness along the greater trochanteric region iliotibial band region a mild degree. Palpation of the gluteal and piriformis muscular treat reproduce moderate discomfort as well there was question decreased sensation of the L5 dermatomal distribution with negative clonus negative Homans. Abdomen nontender with no costovertebral tenderness noted.      Assessment & Plan:     Degenerative changes lumbar spine  L4-L5 disc protrusion, with narrowing of the left neural foramen at this level, L4-5 annular disc bulging and left paracentral disc protrusion.   Lumbar facet syndrome   Lumbar radiculopathy   sacroiliac joint dysfunction  Greater trochanteric bursitis   Greater occipital neuralgia     PLAN   Continue present medication Flexeril and hydrocodone acetaminophen  F/U PCP Dr. Lisette Grinder III for evaliation of  BP and  general  medical  condition  F/U surgical evaluation. Ask receptionist date of neurosurgical evaluation  F/U neurological evaluation. May consider pending follow-up evaluations  F/U Dr.Reddy as discussed   May consider radiofrequency rhizolysis or intraspinal procedures pending response to present treatment and F/U evaluation   Patient to call Pain Management Center should patient have concerns prior to scheduled return appointment

## 2015-04-05 IMAGING — MG MM CAD SCREENING MAMMO
1 series · 5 of 5 positions shown · non-contrast
Comparison: none

REASON FOR EXAM: SCR MAMMO NO ORDER
COMMENTS:

PROCEDURE:     MAM - MAM DGTL SCRN MAM NO ORDER W/CAD  - May 25, 2012  [DATE]
RESULT:     A comparison made to prior study dated 08/05/2007. Breast are
nodular with benign calcifications; stereotactic clip present in the upper
portion of the right breast. Tissue adjacent to the clip stable.

[R CC · right · 5 of 5 slices shown]
[im 1/5]
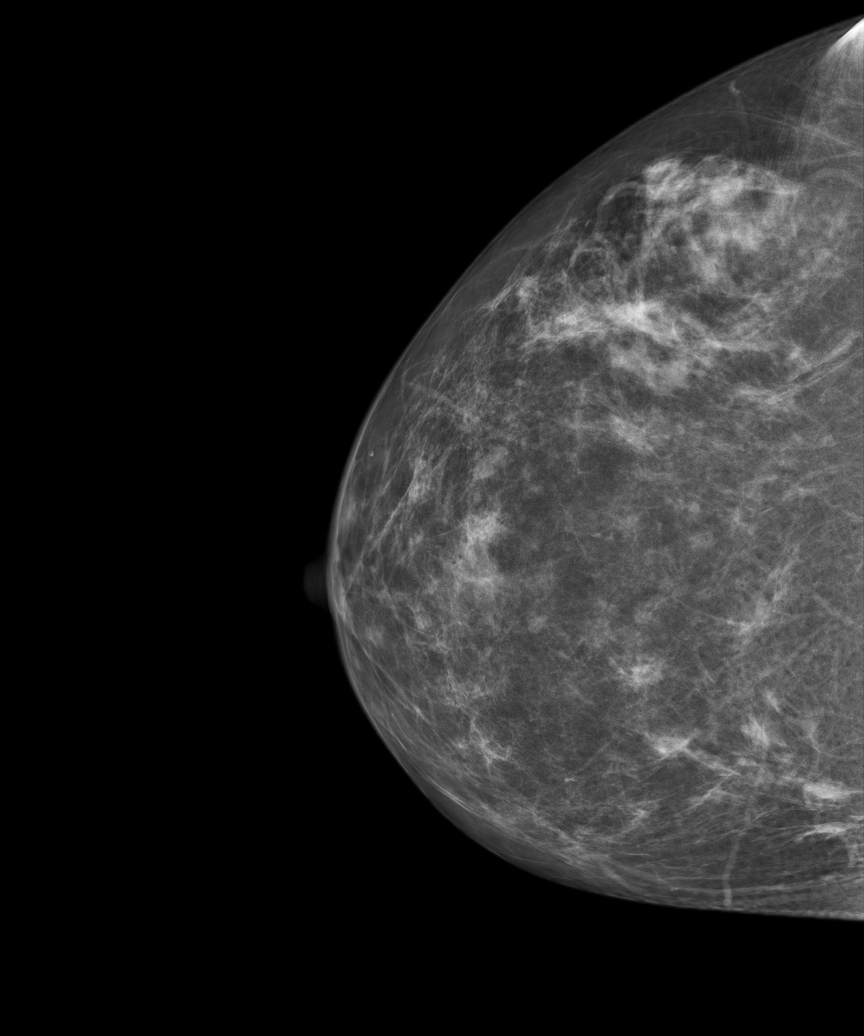
[im 2/5]
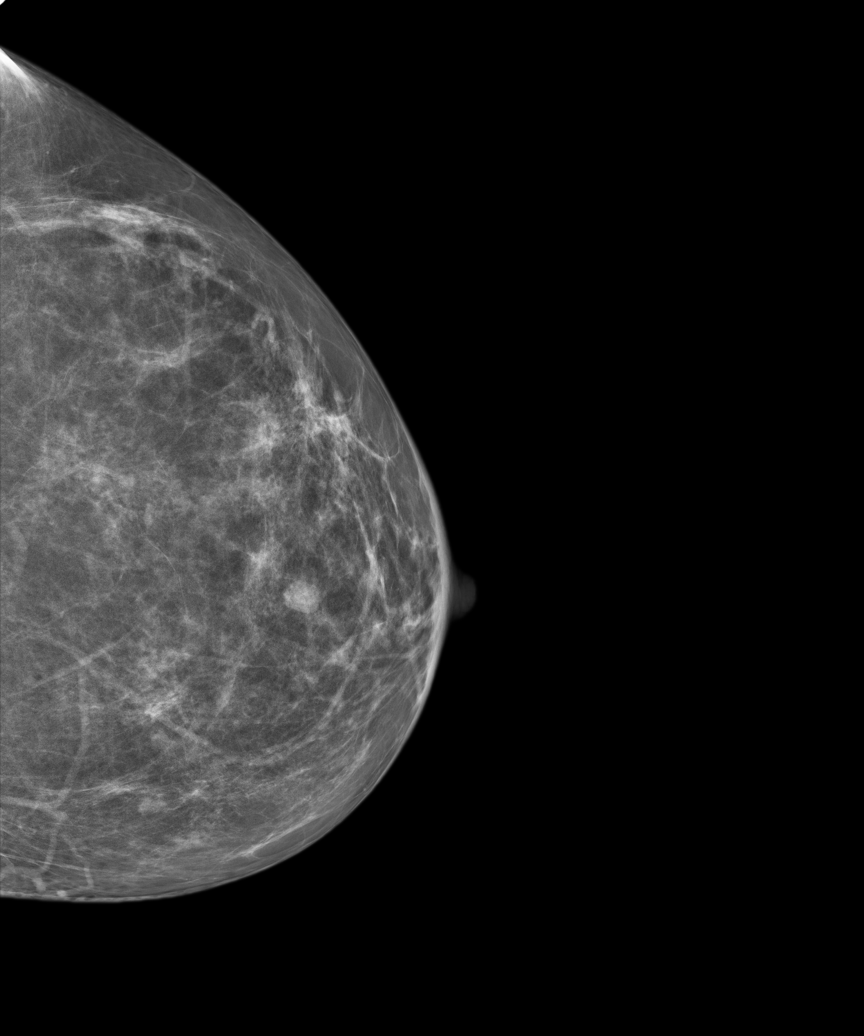
[im 3/5]
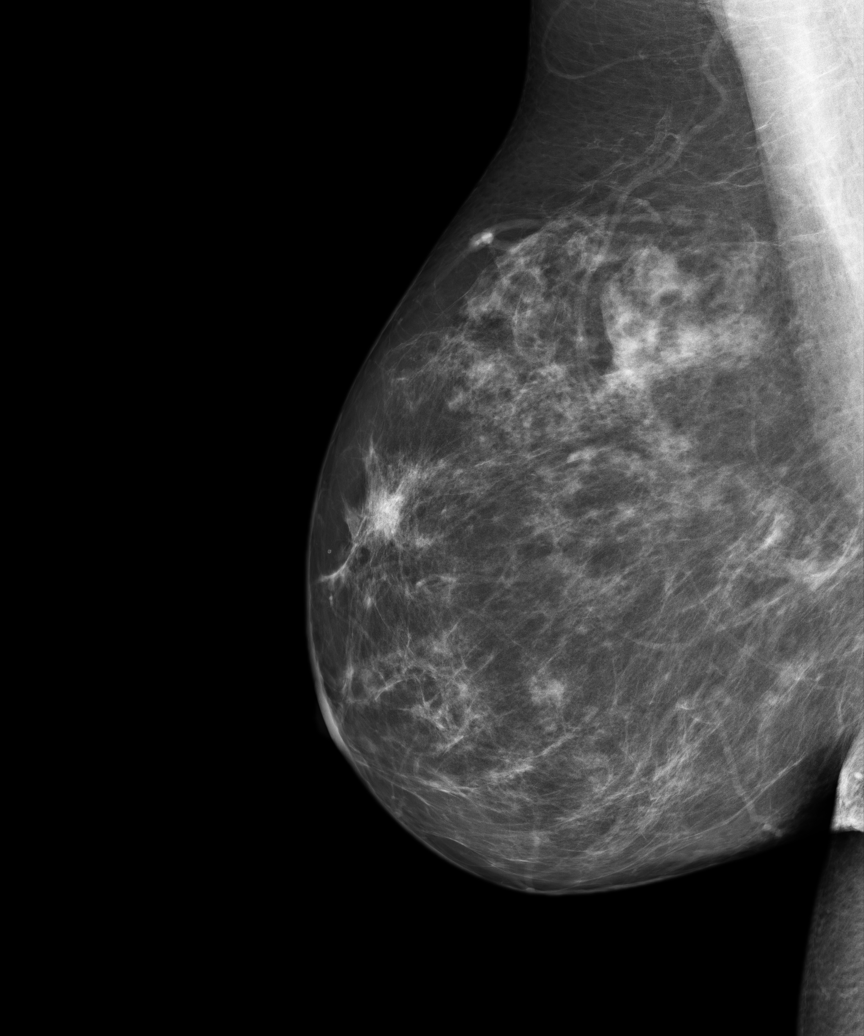
[im 4/5]
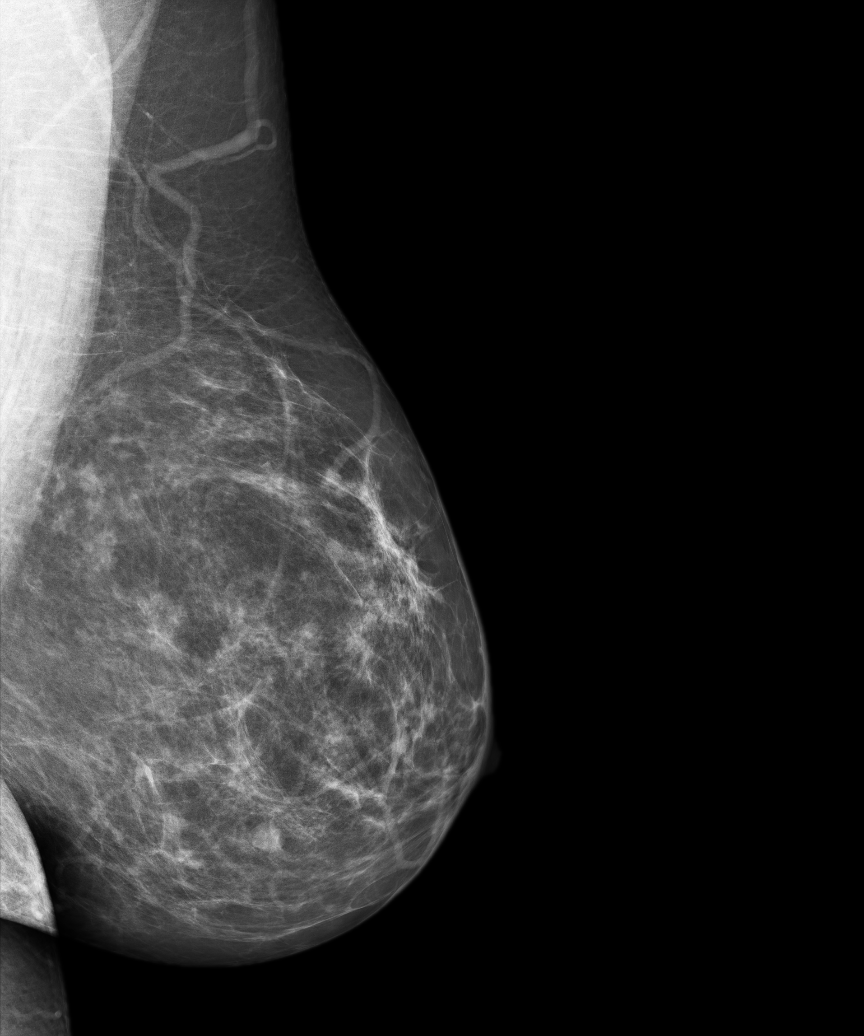
[im 5/5]
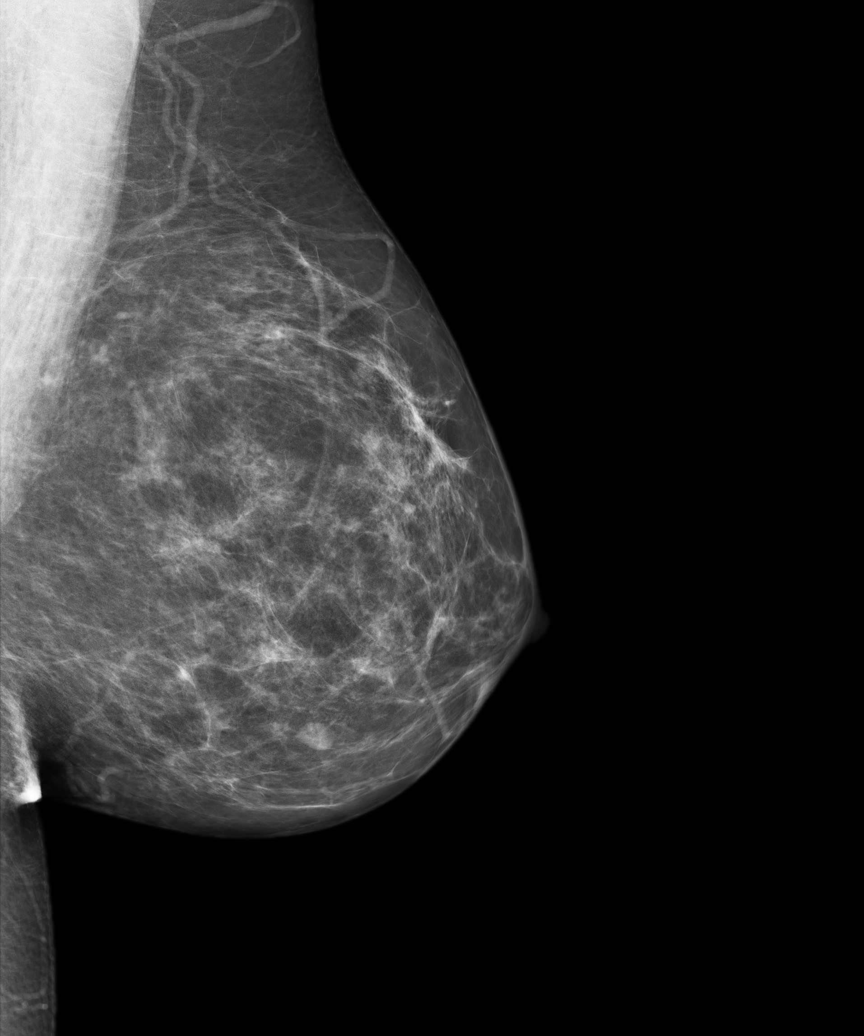

[5 of 5 positions shown; findings below may reference images not displayed]

IMPRESSION: Benign exam. Yearly followup mammogram suggested.

BI-RADS: Category 2- Benign Finding

A NEGATIVE MAMMOGRAM REPORT DOES NOT PRECLUDE BIOPSY OR OTHER EVALUATION OF
A CLINICALLY PALPABLE OR OTHERWISE SUSPICIOUS MASS OR LESION. BREAST CANCER
MAY NOT BE DETECTED IN UP TO 10% OF CASES.

Thank you for the oppurtunity to contribute to the care of your patient. .

BREAST COMPOSITION: The breast composition is HETEROGENEOUSLY DENSE
(glandular tissue is 51-75%) This may decrease the sensitivity of
mammography.

## 2015-04-07 ENCOUNTER — Other Ambulatory Visit: Payer: Self-pay | Admitting: Pain Medicine

## 2015-04-17 ENCOUNTER — Ambulatory Visit: Payer: BLUE CROSS/BLUE SHIELD | Attending: Pain Medicine | Admitting: Pain Medicine

## 2015-04-17 ENCOUNTER — Encounter: Payer: Self-pay | Admitting: Pain Medicine

## 2015-04-17 VITALS — BP 118/74 | HR 85 | Temp 97.5°F | Resp 16 | Ht 63.0 in | Wt 170.0 lb

## 2015-04-17 DIAGNOSIS — M5136 Other intervertebral disc degeneration, lumbar region: Secondary | ICD-10-CM

## 2015-04-17 DIAGNOSIS — M461 Sacroiliitis, not elsewhere classified: Secondary | ICD-10-CM

## 2015-04-17 DIAGNOSIS — M533 Sacrococcygeal disorders, not elsewhere classified: Secondary | ICD-10-CM | POA: Diagnosis not present

## 2015-04-17 DIAGNOSIS — M7060 Trochanteric bursitis, unspecified hip: Secondary | ICD-10-CM | POA: Insufficient documentation

## 2015-04-17 DIAGNOSIS — M5116 Intervertebral disc disorders with radiculopathy, lumbar region: Secondary | ICD-10-CM | POA: Insufficient documentation

## 2015-04-17 DIAGNOSIS — M5126 Other intervertebral disc displacement, lumbar region: Secondary | ICD-10-CM | POA: Diagnosis not present

## 2015-04-17 DIAGNOSIS — M5416 Radiculopathy, lumbar region: Secondary | ICD-10-CM

## 2015-04-17 DIAGNOSIS — M5481 Occipital neuralgia: Secondary | ICD-10-CM | POA: Insufficient documentation

## 2015-04-17 DIAGNOSIS — M545 Low back pain: Secondary | ICD-10-CM | POA: Diagnosis present

## 2015-04-17 DIAGNOSIS — M79606 Pain in leg, unspecified: Secondary | ICD-10-CM | POA: Diagnosis present

## 2015-04-17 DIAGNOSIS — M47816 Spondylosis without myelopathy or radiculopathy, lumbar region: Secondary | ICD-10-CM

## 2015-04-17 DIAGNOSIS — M47818 Spondylosis without myelopathy or radiculopathy, sacral and sacrococcygeal region: Secondary | ICD-10-CM

## 2015-04-17 MED ORDER — HYDROCODONE-ACETAMINOPHEN 5-325 MG PO TABS: ORAL_TABLET | ORAL | Status: DC

## 2015-04-17 MED ORDER — CYCLOBENZAPRINE HCL 10 MG PO TABS: ORAL_TABLET | ORAL | Status: DC

## 2015-04-17 NOTE — Progress Notes (Signed)
Subjective:    Patient ID: CHARLYN SOMERVILLE, female    DOB: November 27, 1965, 50 y.o.   MRN: [id number]  HPI  The patient is a 50 year old female who returns to pain management for further evaluation and treatment of pain involving the lower back and lower extremity region. The patient states that she has severely disabling pain which began insidiously. The patient states the pain radiation the lumbar region to the lower extremity on the left. The patient states the pain becomes more intense as patient spends more time on the feet. Patient continues medications hydrocodone acetaminophen and Flexeril and the pain is quite disabling despite present treatment. Patient states that the pain is associated with numbness and tingling at times with pain occurring across the region of the buttocks radiating to the buttocks on the left as well as on the right. The patient has difficulty standing walking for any significant period of time and states that there is significant tenderness to palpation of the region of the hip which it appears patient's ability to obtain restful sleep as well. We discussed patient's condition and will proceed with block of nerves to the sacroiliac joint at time of return appointment in attempt to decrease severity of patient's symptoms, minimize progression of patient's symptoms, and avoid the need for more involved treatment. All agreed with suggested treatment plan.           Review of Systems     Objective:   Physical Exam  There was tenderness to palpation of the splenius capitis and occipitalis musculature regions of mild degree with mild tenderness over the cervical facet cervical paraspinal musculature region. Palpation over the region of the thoracic facet thoracic paraspinal musculature region was attends to palpation of mild to moderate degree with no crepitus of the thoracic region noted. There was tenderness of the acromioclavicular and glenohumeral joint region and  patient was with what appeared to be bilaterally equal grip strength. Tinel and Phalen's maneuver were without increase of pain of significant degree. Palpation over the thoracic region thoracic facet region was with moderate tenderness to palpation in the lower thoracic region with no crepitus of the thoracic region noted. Palpation over the lumbar paraspinal musculature region lumbar facet region was with moderate to severe discomfort with lateral bending rotation extension and palpation over the lumbar facets reproducing moderately severe discomfort. Palpation over the PSIS and PII S region reproduced severe discomfort. Severe tenderness to palpation of the greater trochanteric region iliotibial band region as well. Straight leg raising was limited to approximately 20 with EHL strength appeared to be decreased. No definite sensory deficit or dermatomal distribution was detected. Reevaluation was with questionably decreased sensation of the L5 dermatomal distribution. There was negative clonus negative Homans. Abdomen was nontender with no costovertebral tenderness noted      Assessment & Plan:     Degenerative changes lumbar spine  L4-L5 disc protrusion, with narrowing of the left neural foramen at this level, L4-5 annular disc bulging and left paracentral disc protrusion.   Lumbar facet syndrome   Lumbar radiculopathy   sacroiliac joint dysfunction  Greater trochanteric bursitis   Greater occipital neuralgia     PLAN   Continue present medication Flexeril and hydrocodone acetaminophen  Block of nerves to the sacroiliac joint to be performed at time of return appointment  F/U PCP Dr. Lisette Grinder III for evaliation of  BP and general medical  condition  F/U surgical evaluation as discussed  F/U neurological evaluation. May consider pending follow-up  evaluations  F/U Dr.Reddy as discussed   May consider radiofrequency rhizolysis or intraspinal procedures pending response to  present treatment and F/U evaluation   Patient to call Pain Management Center should patient have concerns prior to scheduled return appointment

## 2015-04-17 NOTE — Progress Notes (Signed)
Patient here today for medication refill.  Safety precautions to be maintained throughout the outpatient stay will include: orient to surroundings, keep bed in low position, maintain call bell within reach at all times, provide assistance with transfer out of bed and ambulation.

## 2015-04-17 NOTE — Patient Instructions (Addendum)
PLAN   Continue present medication Flexeril and hydrocodone acetaminophen  Block of nerves to the sacroiliac joint to be performed at time of return appointment  F/U PCP Dr. Lisette Grinder III for evaliation of  BP and general medical  condition  F/U surgical evaluation as discussed  F/U neurological evaluation. May consider pending follow-up evaluations  F/U Dr.Reddy as discussed   May consider radiofrequency rhizolysis or intraspinal procedures pending response to present treatment and F/U evaluation   Patient to call [facility] should patient have concerns prior to scheduled return appointmentPain Management Discharge Instructions  General Discharge Instructions :  If you need to reach your doctor call: Monday-Friday 8:00 am - 4:00 pm at [phone] or toll free [phone].  After clinic hours [phone] to have operator reach doctor.  Bring all of your medication bottles to all your appointments in the pain clinic.  To cancel or reschedule your appointment with Pain Management please remember to call 24 hours in advance to avoid a fee.  Refer to the educational materials which you have been given on: General Risks, I had my Procedure. Discharge Instructions, Post Sedation.  Post Procedure Instructions:  The drugs you were given will stay in your system until tomorrow, so for the next 24 hours you should not drive, make any legal decisions or drink any alcoholic beverages.  You may eat anything you prefer, but it is better to start with liquids then soups and crackers, and gradually work up to solid foods.  Please notify your doctor immediately if you have any unusual bleeding, trouble breathing or pain that is not related to your normal pain.  Depending on the type of procedure that was done, some parts of your body may feel week and/or numb.  This usually clears up by tonight or the next day.  Walk with the use of an assistive device or accompanied by an  adult for the 24 hours.  You may use ice on the affected area for the first 24 hours.  Put ice in a Ziploc bag and cover with a towel and place against area 15 minutes on 15 minutes off.  You may switch to heat after 24 hours.Sacroiliac (SI) Joint Injection Patient Information  Description: The sacroiliac joint connects the scrum (very low back and tailbone) to the ilium (a pelvic bone which also forms half of the hip joint).  Normally this joint experiences very little motion.  When this joint becomes inflamed or unstable low back and or hip and pelvis pain may result.  Injection of this joint with local anesthetics (numbing medicines) and steroids can provide diagnostic information and reduce pain.  This injection is performed with the aid of x-ray guidance into the tailbone area while you are lying on your stomach.   You may experience an electrical sensation down the leg while this is being done.  You may also experience numbness.  We also may ask if we are reproducing your normal pain during the injection.  Conditions which may be treated SI injection:   Low back, buttock, hip or leg pain  Preparation for the Injection:  1. Do not eat any solid food or dairy products within 8 hours of your appointment.  2. You may drink clear liquids up to 3 hours before appointment.  Clear liquids include water, black coffee, juice or soda.  No milk or cream please. 3. You may take your regular medications, including pain medications with a sip of water before your appointment.  Diabetics  should hold regular insulin (if take separately) and take 1/2 normal NPH dose the morning of the procedure.  Carry some sugar containing items with you to your appointment. 4. A driver must accompany you and be prepared to drive you home after your procedure. 5. Bring all of your current medications with you. 6. An IV may be inserted and sedation may be given at the discretion of the physician. 7. A blood pressure cuff,  EKG and other monitors will often be applied during the procedure.  Some patients may need to have extra oxygen administered for a short period.  8. You will be asked to provide medical information, including your allergies, prior to the procedure.  We must know immediately if you are taking blood thinners (like Coumadin/Warfarin) or if you are allergic to IV iodine contrast (dye).  We must know if you could possible be pregnant.  Possible side effects:   Bleeding from needle site  Infection (rare, may require surgery)  Nerve injury (rare)  Numbness & tingling (temporary)  A brief convulsion or seizure  Light-headedness (temporary)  Pain at injection site (several days)  Decreased blood pressure (temporary)  Weakness in the leg (temporary)   Call if you experience:   New onset weakness or numbness of an extremity below the injection site that last more than 8 hours.  Hives or difficulty breathing ( go to the emergency room)  Inflammation or drainage at the injection site  Any new symptoms which are concerning to you  Please note:  Although the local anesthetic injected can often make your back/ hip/ buttock/ leg feel good for several hours after the injections, the pain will likely return.  It takes 3-7 days for steroids to work in the sacroiliac area.  You may not notice any pain relief for at least that one week.  If effective, we will often do a series of three injections spaced 3-6 weeks apart to maximally decrease your pain.  After the initial series, we generally will wait some months before a repeat injection of the same type.  If you have any questions, please call [phone] [place]  What are the risk, side effects and possible complications? Generally speaking, most procedures are safe.  However, with any procedure there are risks, side effects, and the possibility of complications.   The risks and complications are dependent upon the sites that are lesioned, or the type of nerve block to be performed.  The closer the procedure is to the spine, the more serious the risks are.  Great care is taken when placing the radio frequency needles, block needles or lesioning probes, but sometimes complications can occur. 1. Infection: Any time there is an injection through the skin, there is a risk of infection.  This is why sterile conditions are used for these blocks.  There are four possible types of infection. 1. Localized skin infection. 2. Central Nervous System Infection-This can be in the form of Meningitis, which can be deadly. 3. Epidural Infections-This can be in the form of an epidural abscess, which can cause pressure inside of the spine, causing compression of the spinal cord with subsequent paralysis. This would require an emergency surgery to decompress, and there are no guarantees that the patient would recover from the paralysis. 4. Discitis-This is an infection of the intervertebral discs.  It occurs in about 1% of discography procedures.  It is difficult to treat and it may lead  to surgery.        2. Pain: the needles have to go through skin and soft tissues, will cause soreness.       3. Damage to internal structures:  The nerves to be lesioned may be near blood vessels or    other nerves which can be potentially damaged.       4. Bleeding: Bleeding is more common if the patient is taking blood thinners such as  aspirin, Coumadin, Ticiid, Plavix, etc., or if he/she have some genetic predisposition  such as hemophilia. Bleeding into the spinal canal can cause compression of the spinal  cord with subsequent paralysis.  This would require an emergency surgery to  decompress and there are no guarantees that the patient would recover from the  paralysis.       5. Pneumothorax:  Puncturing of a lung is a possibility, every time a needle is introduced in  the area of the chest or  upper back.  Pneumothorax refers to free air around the  collapsed lung(s), inside of the thoracic cavity (chest cavity).  Another two possible  complications related to a similar event would include: Hemothorax and Chylothorax.   These are variations of the Pneumothorax, where instead of air around the collapsed  lung(s), you may have blood or chyle, respectively.       6. Spinal headaches: They may occur with any procedures in the area of the spine.       7. Persistent CSF (Cerebro-Spinal Fluid) leakage: This is a rare problem, but may occur  with prolonged intrathecal or epidural catheters either due to the formation of a fistulous  track or a dural tear.       8. Nerve damage: By working so close to the spinal cord, there is always a possibility of  nerve damage, which could be as serious as a permanent spinal cord injury with  paralysis.       9. Death:  Although rare, severe deadly allergic reactions known as "Anaphylactic  reaction" can occur to any of the medications used.      10. Worsening of the symptoms:  We can always make thing worse.  What are the chances of something like this happening? Chances of any of this occuring are extremely low.  By statistics, you have more of a chance of getting killed in a motor vehicle accident: while driving to the hospital than any of the above occurring .  Nevertheless, you should be aware that they are possibilities.  In general, it is similar to taking a shower.  Everybody knows that you can slip, hit your head and get killed.  Does that mean that you should not shower again?  Nevertheless always keep in mind that statistics do not mean anything if you happen to be on the wrong side of them.  Even if a procedure has a 1 (one) in a 1,000,000 (million) chance of going wrong, it you happen to be that one..Also, keep in mind that by statistics, you have more of a chance of having something go wrong when taking medications.  Who should not have this  procedure? If you are on a blood thinning medication (e.g. Coumadin, Plavix, see list of "Blood Thinners"), or if you have an active infection going on, you should not have the procedure.  If you are taking any blood thinners, please inform your physician.  How should I prepare for this procedure?  Do not eat or drink anything at least six  hours prior to the procedure.  Bring a driver with you .  It cannot be a taxi.  Come accompanied by an adult that can drive you back, and that is strong enough to help you if your legs get weak or numb from the local anesthetic.  Take all of your medicines the morning of the procedure with just enough water to swallow them.  If you have diabetes, make sure that you are scheduled to have your procedure done first thing in the morning, whenever possible.  If you have diabetes, take only half of your insulin dose and notify our nurse that you have done so as soon as you arrive at the clinic.  If you are diabetic, but only take blood sugar pills (oral hypoglycemic), then do not take them on the morning of your procedure.  You may take them after you have had the procedure.  Do not take aspirin or any aspirin-containing medications, at least eleven (11) days prior to the procedure.  They may prolong bleeding.  Wear loose fitting clothing that may be easy to take off and that you would not mind if it got stained with Betadine or blood.  Do not wear any jewelry or perfume  Remove any nail coloring.  It will interfere with some of our monitoring equipment.  NOTE: Remember that this is not meant to be interpreted as a complete list of all possible complications.  Unforeseen problems may occur.  BLOOD THINNERS The following drugs contain aspirin or other products, which can cause increased bleeding during surgery and should not be taken for 2 weeks prior to and 1 week after surgery.  If you should need take something for relief of minor pain, you may take  acetaminophen which is found in Tylenol,m Datril, Anacin-3 and Panadol. It is not blood thinner. The products listed below are.  Do not take any of the products listed below in addition to any listed on your instruction sheet.  A.P.C or A.P.C with Codeine Codeine Phosphate Capsules #3 Ibuprofen Ridaura  ABC compound Congesprin Imuran rimadil  Advil Cope Indocin Robaxisal  Alka-Seltzer Effervescent Pain Reliever and Antacid Coricidin or Coricidin-D  Indomethacin Rufen  Alka-Seltzer plus Cold Medicine Cosprin Ketoprofen S-A-C Tablets  Anacin Analgesic Tablets or Capsules Coumadin Korlgesic Salflex  Anacin Extra Strength Analgesic tablets or capsules CP-2 Tablets Lanoril Salicylate  Anaprox Cuprimine Capsules Levenox Salocol  Anexsia-D Dalteparin Magan Salsalate  Anodynos Darvon compound Magnesium Salicylate Sine-off  Ansaid Dasin Capsules Magsal Sodium Salicylate  Anturane Depen Capsules Marnal Soma  APF Arthritis pain formula Dewitt's Pills Measurin Stanback  Argesic Dia-Gesic Meclofenamic Sulfinpyrazone  Arthritis Bayer Timed Release Aspirin Diclofenac Meclomen Sulindac  Arthritis pain formula Anacin Dicumarol Medipren Supac  Analgesic (Safety coated) Arthralgen Diffunasal Mefanamic Suprofen  Arthritis Strength Bufferin Dihydrocodeine Mepro Compound Suprol  Arthropan liquid Dopirydamole Methcarbomol with Aspirin Synalgos  ASA tablets/Enseals Disalcid Micrainin Tagament  Ascriptin Doan's Midol Talwin  Ascriptin A/D Dolene Mobidin Tanderil  Ascriptin Extra Strength Dolobid Moblgesic Ticlid  Ascriptin with Codeine Doloprin or Doloprin with Codeine Momentum Tolectin  Asperbuf Duoprin Mono-gesic Trendar  Aspergum Duradyne Motrin or Motrin IB Triminicin  Aspirin plain, buffered or enteric coated Durasal Myochrisine Trigesic  Aspirin Suppositories Easprin Nalfon Trillsate  Aspirin with Codeine Ecotrin Regular or Extra Strength Naprosyn Uracel  Atromid-S Efficin Naproxen Ursinus  Auranofin  Capsules Elmiron Neocylate Vanquish  Axotal Emagrin Norgesic Verin  Azathioprine Empirin or Empirin with Codeine Normiflo Vitamin E  Azolid Emprazil Nuprin Voltaren  Bayer Aspirin plain,  buffered or children's or timed BC Tablets or powders Encaprin Orgaran Warfarin Sodium  Buff-a-Comp Enoxaparin Orudis Zorpin  Buff-a-Comp with Codeine Equegesic Os-Cal-Gesic   Buffaprin Excedrin plain, buffered or Extra Strength Oxalid   Bufferin Arthritis Strength Feldene Oxphenbutazone   Bufferin plain or Extra Strength Feldene Capsules Oxycodone with Aspirin   Bufferin with Codeine Fenoprofen Fenoprofen Pabalate or Pabalate-SF   Buffets II Flogesic Panagesic   Buffinol plain or Extra Strength Florinal or Florinal with Codeine Panwarfarin   Buf-Tabs Flurbiprofen Penicillamine   Butalbital Compound Four-way cold tablets Penicillin   Butazolidin Fragmin Pepto-Bismol   Carbenicillin Geminisyn Percodan   Carna Arthritis Reliever Geopen Persantine   Carprofen Gold's salt Persistin   Chloramphenicol Goody's Phenylbutazone   Chloromycetin Haltrain Piroxlcam   Clmetidine heparin Plaquenil   Cllnoril Hyco-pap Ponstel   Clofibrate Hydroxy chloroquine Propoxyphen         Before stopping any of these medications, be sure to consult the physician who ordered them.  Some, such as Coumadin (Warfarin) are ordered to prevent or treat serious conditions such as "deep thrombosis", "pumonary embolisms", and other heart problems.  The amount of time that you may need off of the medication may also vary with the medication and the reason for which you were taking it.  If you are taking any of these medications, please make sure you notify your pain physician before you undergo any procedures.

## 2015-04-24 ENCOUNTER — Encounter: Payer: Self-pay | Admitting: Pain Medicine

## 2015-04-24 ENCOUNTER — Ambulatory Visit: Payer: BLUE CROSS/BLUE SHIELD | Attending: Pain Medicine | Admitting: Pain Medicine

## 2015-04-24 VITALS — BP 117/69 | HR 79 | Temp 98.2°F | Resp 16 | Ht 63.0 in | Wt 170.0 lb

## 2015-04-24 DIAGNOSIS — M545 Low back pain: Secondary | ICD-10-CM | POA: Insufficient documentation

## 2015-04-24 DIAGNOSIS — M5126 Other intervertebral disc displacement, lumbar region: Secondary | ICD-10-CM | POA: Diagnosis not present

## 2015-04-24 DIAGNOSIS — M461 Sacroiliitis, not elsewhere classified: Secondary | ICD-10-CM

## 2015-04-24 DIAGNOSIS — M5136 Other intervertebral disc degeneration, lumbar region: Secondary | ICD-10-CM

## 2015-04-24 DIAGNOSIS — M5416 Radiculopathy, lumbar region: Secondary | ICD-10-CM

## 2015-04-24 DIAGNOSIS — M47816 Spondylosis without myelopathy or radiculopathy, lumbar region: Secondary | ICD-10-CM

## 2015-04-24 DIAGNOSIS — M79606 Pain in leg, unspecified: Secondary | ICD-10-CM | POA: Diagnosis present

## 2015-04-24 DIAGNOSIS — M5481 Occipital neuralgia: Secondary | ICD-10-CM

## 2015-04-24 DIAGNOSIS — M47818 Spondylosis without myelopathy or radiculopathy, sacral and sacrococcygeal region: Secondary | ICD-10-CM

## 2015-04-24 MED ORDER — MIDAZOLAM HCL 5 MG/5ML IJ SOLN
INTRAMUSCULAR | Status: AC
  Administered 2015-04-24: 5 mg via INTRAVENOUS
  Filled 2015-04-24: qty 5

## 2015-04-24 MED ORDER — TRIAMCINOLONE ACETONIDE 40 MG/ML IJ SUSP
INTRAMUSCULAR | Status: AC
  Administered 2015-04-24: 14:00:00
  Filled 2015-04-24: qty 1

## 2015-04-24 MED ORDER — TRIAMCINOLONE ACETONIDE 40 MG/ML IJ SUSP
40.0000 mg | Freq: Once | INTRAMUSCULAR | Status: DC
Start: 2015-04-24 — End: 2015-05-17

## 2015-04-24 MED ORDER — FENTANYL CITRATE (PF) 100 MCG/2ML IJ SOLN: 100.0000 ug | Freq: Once | INTRAMUSCULAR | Status: DC

## 2015-04-24 MED ORDER — ORPHENADRINE CITRATE 30 MG/ML IJ SOLN
INTRAMUSCULAR | Status: AC
  Administered 2015-04-24: 14:00:00
  Filled 2015-04-24: qty 2

## 2015-04-24 MED ORDER — BUPIVACAINE HCL (PF) 0.25 % IJ SOLN
INTRAMUSCULAR | Status: AC
  Administered 2015-04-24: 14:00:00
  Filled 2015-04-24: qty 30

## 2015-04-24 MED ORDER — FENTANYL CITRATE (PF) 100 MCG/2ML IJ SOLN
INTRAMUSCULAR | Status: AC
  Administered 2015-04-24: 100 ug via INTRAVENOUS
  Filled 2015-04-24: qty 2

## 2015-04-24 MED ORDER — ORPHENADRINE CITRATE 30 MG/ML IJ SOLN: 60.0000 mg | Freq: Once | INTRAMUSCULAR | Status: DC

## 2015-04-24 MED ORDER — BUPIVACAINE HCL (PF) 0.25 % IJ SOLN: 30.0000 mL | Freq: Once | INTRAMUSCULAR | Status: DC

## 2015-04-24 MED ORDER — LACTATED RINGERS IV SOLN: 1000.0000 mL | INTRAVENOUS | Status: DC

## 2015-04-24 MED ORDER — MIDAZOLAM HCL 5 MG/5ML IJ SOLN: 5.0000 mg | Freq: Once | INTRAMUSCULAR | Status: DC

## 2015-04-24 NOTE — Progress Notes (Signed)
Subjective:    Patient ID: Meagan Bridges, female    DOB: [birth year], 51 y.o.   MRN: [id number]  HPI PROCEDURE:  Block of nerves to the sacroiliac joint.   NOTE:  The patient is a 50 y.o. female who returns to the [facility] for further evaluation and treatment of pain involving the lower back and lower extremity region with pain in the region of the buttocks as well. 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 We will proceed with what is felt to be a medically necessary procedure, block of nerves to the sacroiliac joint.   DESCRIPTION OF PROCEDURE:  Block of nerves to the sacroiliac joint.   The patient was taken to the fluoroscopy suite. With the patient in the prone position with EKG, blood pressure, pulse and pulse oximetry monitoring, IV Versed, IV fentanyl conscious sedation, Betadine prep of proposed entry site was performed.   Block of nerves at the L5 vertebral body level.   With the patient in prone position, under fluoroscopic guidance, a 22 -gauge needle was inserted at the L5 vertebral body level on the left side. With 15 degrees oblique orientation a 22 -gauge needle was inserted in the region known as Burton's eye or eye of the Scotty dog. Following documentation of needle  placement in the area of Burton's eye or eye of the Scotty dog under fluoroscopic guidance, needle placement was then accomplished at the sacral ala level on the left side.   Needle placement at the sacral ala.   With the patient in prone position under fluoroscopic guidance with AP view of the lumbosacral spine, a 22 -gauge needle was inserted in the region known as the sacral ala on the left side. Following documentation of needle placement on the left side under fluoroscopic guidance needle placement was then accomplished at the S1 foramen level.   Needle placement at the S1 foramen level.   With the patient in prone position under fluoroscopic guidance with AP view of the lumbosacral spine and cephalad orientation, a 22 -gauge needle was inserted at the superior and lateral border of the S1 foramen on the left side. Following documentation of needle placement at the S1 foramen level on the left side, needle placement was then accomplished at the S2 foramen level on the left side.   Needle placement at the S2 foramen level.   With the patient in prone position with AP view of the lumbosacral spine with cephalad orientation, a 22 - gauge needle was inserted at the superior and lateral border of the S2 foramen under fluoroscopic guidance on the left side. Following needle placement at the L5 vertebral body level, sacral ala, S1 foramen and S2 foramen on the left side, needle placement was verified on lateral view under fluoroscopic guidance.  Following needle placement documentation on lateral view, each needle was injected with  1 mL of 0.25% bupivacaine and Kenalog.   BLOCK OF THE NERVES TO SACROILIAC JOINT ON THE RIGHT SIDE The procedure was performed on the right side at the same levels as was performed on the left side and utilizing the same technique as on the left side and was performed under fluoroscopic guidance as on the left side  Myoneural block injection of the gluteal musculature  region Following Betadine prep of proposed entry site a 22-gauge needle was inserted in the gluteal musculature region and following negative aspiration 4 cc of 0.25% bupivacaine with Norflex was injected for myoneural block injection of the gluteal musculature region times two.  The patient tolerated the procedure well  A total of 10mg  of Kenalog was utilized for the procedure.   PLAN:  1. Medications: The patient will continue presently prescribed medications. Flexeril and hydrocodone acetaminophen 2. The patient will be considered for modification of treatment regimen pending response to the procedure performed on today's visit.  3. The patient is to follow-up with primary care physician Dr. Lerry Paterson  for evaluation of blood pressure and general medical condition following the procedure performed on today's visit.  4. Surgical evaluation as discussed.  5. Neurological evaluation as discussed.  6. The patient may be a candidate for radiofrequency procedures, implantation devices and other treatment pending response to treatment performed on today's visit and follow-up evaluation.  7. The patient has been advised to adhere to proper body mechanics and to avoid activities which may exacerbate the patient's symptoms.   Return appointment to Pain Management Center as scheduled.     Review of Systems     Objective:   Physical Exam        Assessment & Plan:

## 2015-04-24 NOTE — Patient Instructions (Addendum)
PLAN   Continue present medication Flexeril and hydrocodone acetaminophen  F/U PCP  Dr. Lisette Grinder  III  for evaliation of  BP and general medical  condition  F/U surgical evaluation. May consider pending follow-up evaluations  F/U neurological evaluation. May consider pending follow-up evaluations  F/U Dr.Reddy as planned    May consider radiofrequency rhizolysis or intraspinal procedures pending response to present treatment and F/U evaluation   Patient to call [facility] should patient have concerns prior to scheduled return appointmetGENERAL RISKS AND COMPLICATIONS  What are the risk, side effects and possible complications? Generally speaking, most procedures are safe.  However, with any procedure there are risks, side effects, and the possibility of complications.  The risks and complications are dependent upon the sites that are lesioned, or the type of nerve block to be performed.  The closer the procedure is to the spine, the more serious the risks are.  Great care is taken when placing the radio frequency needles, block needles or lesioning probes, but sometimes complications can occur. 1. Infection: Any time there is an injection through the skin, there is a risk of infection.  This is why sterile conditions are used for these blocks.  There are four possible types of infection. 1. Localized skin infection. 2. Central Nervous System Infection-This can be in the form of Meningitis, which can be deadly. 3. Epidural Infections-This can be in the form of an epidural abscess, which can cause pressure inside of the spine, causing compression of the spinal cord with subsequent paralysis. This would require an emergency surgery to decompress, and there are no guarantees that the patient would recover from the paralysis. 4. Discitis-This is an infection of the intervertebral discs.  It occurs in about 1% of discography procedures.  It is difficult to treat and it may lead to  surgery.        2. Pain: the needles have to go through skin and soft tissues, will cause soreness.       3. Damage to internal structures:  The nerves to be lesioned may be near blood vessels or    other nerves which can be potentially damaged.       4. Bleeding: Bleeding is more common if the patient is taking blood thinners such as  aspirin, Coumadin, Ticiid, Plavix, etc., or if he/she have some genetic predisposition  such as hemophilia. Bleeding into the spinal canal can cause compression of the spinal  cord with subsequent paralysis.  This would require an emergency surgery to  decompress and there are no guarantees that the patient would recover from the  paralysis.       5. Pneumothorax:  Puncturing of a lung is a possibility, every time a needle is introduced in  the area of the chest or upper back.  Pneumothorax refers to free air around the  collapsed lung(s), inside of the thoracic cavity (chest cavity).  Another two possible  complications related to a similar event would include: Hemothorax and Chylothorax.   These are variations of the Pneumothorax, where instead of air around the collapsed  lung(s), you may have blood or chyle, respectively.       6. Spinal headaches: They may occur with any procedures in the area of the spine.       7. Persistent CSF (Cerebro-Spinal Fluid) leakage: This is a rare problem, but may occur  with prolonged intrathecal or epidural catheters either due to the formation of a fistulous  track or a  dural tear.       8. Nerve damage: By working so close to the spinal cord, there is always a possibility of  nerve damage, which could be as serious as a permanent spinal cord injury with  paralysis.       9. Death:  Although rare, severe deadly allergic reactions known as "Anaphylactic  reaction" can occur to any of the medications used.      10. Worsening of the symptoms:  We can always make thing worse.  What are the chances of something like this  happening? Chances of any of this occuring are extremely low.  By statistics, you have more of a chance of getting killed in a motor vehicle accident: while driving to the hospital than any of the above occurring .  Nevertheless, you should be aware that they are possibilities.  In general, it is similar to taking a shower.  Everybody knows that you can slip, hit your head and get killed.  Does that mean that you should not shower again?  Nevertheless always keep in mind that statistics do not mean anything if you happen to be on the wrong side of them.  Even if a procedure has a 1 (one) in a 1,000,000 (million) chance of going wrong, it you happen to be that one..Also, keep in mind that by statistics, you have more of a chance of having something go wrong when taking medications.  Who should not have this procedure? If you are on a blood thinning medication (e.g. Coumadin, Plavix, see list of "Blood Thinners"), or if you have an active infection going on, you should not have the procedure.  If you are taking any blood thinners, please inform your physician.  How should I prepare for this procedure?  Do not eat or drink anything at least six hours prior to the procedure.  Bring a driver with you .  It cannot be a taxi.  Come accompanied by an adult that can drive you back, and that is strong enough to help you if your legs get weak or numb from the local anesthetic.  Take all of your medicines the morning of the procedure with just enough water to swallow them.  If you have diabetes, make sure that you are scheduled to have your procedure done first thing in the morning, whenever possible.  If you have diabetes, take only half of your insulin dose and notify our nurse that you have done so as soon as you arrive at the clinic.  If you are diabetic, but only take blood sugar pills (oral hypoglycemic), then do not take them on the morning of your procedure.  You may take them after you have had the  procedure.  Do not take aspirin or any aspirin-containing medications, at least eleven (11) days prior to the procedure.  They may prolong bleeding.  Wear loose fitting clothing that may be easy to take off and that you would not mind if it got stained with Betadine or blood.  Do not wear any jewelry or perfume  Remove any nail coloring.  It will interfere with some of our monitoring equipment.  NOTE: Remember that this is not meant to be interpreted as a complete list of all possible complications.  Unforeseen problems may occur.  BLOOD THINNERS The following drugs contain aspirin or other products, which can cause increased bleeding during surgery and should not be taken for 2 weeks prior to and 1 week after surgery.  If you  should need take something for relief of minor pain, you may take acetaminophen which is found in Tylenol,m Datril, Anacin-3 and Panadol. It is not blood thinner. The products listed below are.  Do not take any of the products listed below in addition to any listed on your instruction sheet.  A.P.C or A.P.C with Codeine Codeine Phosphate Capsules #3 Ibuprofen Ridaura  ABC compound Congesprin Imuran rimadil  Advil Cope Indocin Robaxisal  Alka-Seltzer Effervescent Pain Reliever and Antacid Coricidin or Coricidin-D  Indomethacin Rufen  Alka-Seltzer plus Cold Medicine Cosprin Ketoprofen S-A-C Tablets  Anacin Analgesic Tablets or Capsules Coumadin Korlgesic Salflex  Anacin Extra Strength Analgesic tablets or capsules CP-2 Tablets Lanoril Salicylate  Anaprox Cuprimine Capsules Levenox Salocol  Anexsia-D Dalteparin Magan Salsalate  Anodynos Darvon compound Magnesium Salicylate Sine-off  Ansaid Dasin Capsules Magsal Sodium Salicylate  Anturane Depen Capsules Marnal Soma  APF Arthritis pain formula Dewitt's Pills Measurin Stanback  Argesic Dia-Gesic Meclofenamic Sulfinpyrazone  Arthritis Bayer Timed Release Aspirin Diclofenac Meclomen Sulindac  Arthritis pain formula  Anacin Dicumarol Medipren Supac  Analgesic (Safety coated) Arthralgen Diffunasal Mefanamic Suprofen  Arthritis Strength Bufferin Dihydrocodeine Mepro Compound Suprol  Arthropan liquid Dopirydamole Methcarbomol with Aspirin Synalgos  ASA tablets/Enseals Disalcid Micrainin Tagament  Ascriptin Doan's Midol Talwin  Ascriptin A/D Dolene Mobidin Tanderil  Ascriptin Extra Strength Dolobid Moblgesic Ticlid  Ascriptin with Codeine Doloprin or Doloprin with Codeine Momentum Tolectin  Asperbuf Duoprin Mono-gesic Trendar  Aspergum Duradyne Motrin or Motrin IB Triminicin  Aspirin plain, buffered or enteric coated Durasal Myochrisine Trigesic  Aspirin Suppositories Easprin Nalfon Trillsate  Aspirin with Codeine Ecotrin Regular or Extra Strength Naprosyn Uracel  Atromid-S Efficin Naproxen Ursinus  Auranofin Capsules Elmiron Neocylate Vanquish  Axotal Emagrin Norgesic Verin  Azathioprine Empirin or Empirin with Codeine Normiflo Vitamin E  Azolid Emprazil Nuprin Voltaren  Bayer Aspirin plain, buffered or children's or timed BC Tablets or powders Encaprin Orgaran Warfarin Sodium  Buff-a-Comp Enoxaparin Orudis Zorpin  Buff-a-Comp with Codeine Equegesic Os-Cal-Gesic   Buffaprin Excedrin plain, buffered or Extra Strength Oxalid   Bufferin Arthritis Strength Feldene Oxphenbutazone   Bufferin plain or Extra Strength Feldene Capsules Oxycodone with Aspirin   Bufferin with Codeine Fenoprofen Fenoprofen Pabalate or Pabalate-SF   Buffets II Flogesic Panagesic   Buffinol plain or Extra Strength Florinal or Florinal with Codeine Panwarfarin   Buf-Tabs Flurbiprofen Penicillamine   Butalbital Compound Four-way cold tablets Penicillin   Butazolidin Fragmin Pepto-Bismol   Carbenicillin Geminisyn Percodan   Carna Arthritis Reliever Geopen Persantine   Carprofen Gold's salt Persistin   Chloramphenicol Goody's Phenylbutazone   Chloromycetin Haltrain Piroxlcam   Clmetidine heparin Plaquenil   Cllnoril Hyco-pap  Ponstel   Clofibrate Hydroxy chloroquine Propoxyphen         Before stopping any of these medications, be sure to consult the physician who ordered them.  Some, such as Coumadin (Warfarin) are ordered to prevent or treat serious conditions such as "deep thrombosis", "pumonary embolisms", and other heart problems.  The amount of time that you may need off of the medication may also vary with the medication and the reason for which you were taking it.  If you are taking any of these medications, please make sure you notify your pain physician before you undergo any procedures.

## 2015-04-24 NOTE — Progress Notes (Signed)
Patient here for procedure for lower back pain.  Safety precautions to be maintained throughout the outpatient stay will include: orient to surroundings, keep bed in low position, maintain call bell within reach at all times, provide assistance with transfer out of bed and ambulation.

## 2015-04-25 ENCOUNTER — Telehealth: Payer: Self-pay | Admitting: *Deleted

## 2015-04-25 NOTE — Telephone Encounter (Signed)
Left voicemail with patient to call our office if there are any questions or concerns re; procedure on yesterday.

## 2015-05-15 ENCOUNTER — Ambulatory Visit: Payer: BLUE CROSS/BLUE SHIELD | Attending: Pain Medicine | Admitting: Pain Medicine

## 2015-05-15 ENCOUNTER — Encounter: Payer: Self-pay | Admitting: Pain Medicine

## 2015-05-15 VITALS — BP 99/47 | HR 86 | Temp 98.0°F | Resp 16 | Ht 63.0 in | Wt 170.0 lb

## 2015-05-15 DIAGNOSIS — M545 Low back pain: Secondary | ICD-10-CM | POA: Diagnosis present

## 2015-05-15 DIAGNOSIS — M1610 Unilateral primary osteoarthritis, unspecified hip: Secondary | ICD-10-CM | POA: Diagnosis not present

## 2015-05-15 DIAGNOSIS — M7060 Trochanteric bursitis, unspecified hip: Secondary | ICD-10-CM | POA: Diagnosis not present

## 2015-05-15 DIAGNOSIS — M169 Osteoarthritis of hip, unspecified: Secondary | ICD-10-CM | POA: Insufficient documentation

## 2015-05-15 DIAGNOSIS — M47816 Spondylosis without myelopathy or radiculopathy, lumbar region: Secondary | ICD-10-CM

## 2015-05-15 DIAGNOSIS — M5116 Intervertebral disc disorders with radiculopathy, lumbar region: Secondary | ICD-10-CM | POA: Insufficient documentation

## 2015-05-15 DIAGNOSIS — M47818 Spondylosis without myelopathy or radiculopathy, sacral and sacrococcygeal region: Secondary | ICD-10-CM

## 2015-05-15 DIAGNOSIS — M533 Sacrococcygeal disorders, not elsewhere classified: Secondary | ICD-10-CM | POA: Diagnosis not present

## 2015-05-15 DIAGNOSIS — M5481 Occipital neuralgia: Secondary | ICD-10-CM | POA: Insufficient documentation

## 2015-05-15 DIAGNOSIS — M5126 Other intervertebral disc displacement, lumbar region: Secondary | ICD-10-CM | POA: Insufficient documentation

## 2015-05-15 DIAGNOSIS — M461 Sacroiliitis, not elsewhere classified: Secondary | ICD-10-CM

## 2015-05-15 DIAGNOSIS — M160 Bilateral primary osteoarthritis of hip: Secondary | ICD-10-CM

## 2015-05-15 DIAGNOSIS — M5416 Radiculopathy, lumbar region: Secondary | ICD-10-CM

## 2015-05-15 MED ORDER — HYDROCODONE-ACETAMINOPHEN 5-325 MG PO TABS: ORAL_TABLET | ORAL | Status: DC

## 2015-05-15 NOTE — Progress Notes (Signed)
Safety precautions to be maintained throughout the outpatient stay will include: orient to surroundings, keep bed in low position, maintain call bell within reach at all times, provide assistance with transfer out of bed and ambulation.  

## 2015-05-15 NOTE — Progress Notes (Signed)
   Subjective:    Patient ID: Meagan Bridges, female    DOB: 09-Jul-1965, 50 y.o.   MRN: [id number]  HPI  The patient is a 50 year old female who returns to pain management for further evaluation and treatment of pain involving the lower back and lower extremity region. The patient has had improvement with prior interventional treatment performed in pain management Center consisting of block of nerves to the sacroiliac joint. The patient states that the present time her pain occurs on the side of the hip aggravated by attempting to lie on the side of her hip. The patient states the pain awakened her from sleep and interferes with activities of daily living as well as. The patient appeared to be with significant component of pain due to greater trochanteric bursitis. We will proceed with interventional treatment consisting of greater trochanteric bursa injection at time return appointment as discussed and will consider patient for additional modifications of treatment pending response to treatment and follow-up evaluation. The patient will continue Flexeril and hydrocodone acetaminophen as prescribed at this time and we will proceed with injection in the region of the hip at time return appointment and treatment of greater trochanteric bursa injection. All agreed to suggested treatment plan      Review of Systems     Objective:   Physical Exam  There was tenderness of the splenius capitis and occipitalis musculature region. Palpation of these regions reproduced pain of mild degree. Palpation of the acromioclavicular and glenohumeral joint region was attends to palpation of mild degree and patient appeared to be with bilaterally equal grip strength with Tinel and Phalen's maneuver reproducing mild discomfort. Palpation over the thoracic region thoracic facet region was attends to palpation with no crepitus of the thoracic region with tenderness noted to be mild degree as well. There was evidence of  moderate muscle spasm of the lower thoracic paraspinal musculature region. Palpation over the lumbar paraspinal muscular treat and lumbar facet region associated with moderate discomfort with lateral bending rotation extension and palpation of the lumbar facets reproducing mild to moderate discomfort. Straight leg raise was tolerates approximately 30 without increased pain with dorsiflexion noted. No sensory deficit or dermatomal distribution detected. Palpation of the gluteal and piriformis musculature region was attends to palpation of moderate degree. There was severe increased pain with palpation of the greater trochanteric region iliotibial band region especially on the right. There was negative clonus negative Homans. Abdomen nontender with no costovertebral tenderness noted.      Assessment & Plan:   Greater trochanteric bursitis  DJD hip  Degenerative changes lumbar spine  L4-L5 disc protrusion, with narrowing of the left neural foramen at this level, L4-5 annular disc bulging and left paracentral disc protrusion.   Lumbar facet syndrome   Lumbar radiculopathy   sacroiliac joint dysfunction  Greater trochanteric bursitis   Greater occipital neuralgia     PLAN   Continue present medication Flexeril and hydrocodone acetaminophen  Hip injection to be performed at time of return appointment  F/U PCP Dr. Lisette Grinder III for evaliation of  BP and general medical  condition  F/U surgical evaluation as discussed  F/U neurological evaluation. May consider [facility] EMG studies and other studies pending follow-up evaluations  F/U Dr.Reddy as discussed   May consider radiofrequency rhizolysis or intraspinal procedures pending response to present treatment and F/U evaluation   Patient to call Pain Management Center should patient have concerns prior to scheduled return appointment

## 2015-05-15 NOTE — Patient Instructions (Addendum)
PLAN   Continue present medication Flexeril and hydrocodone acetaminophen  Hip injection to be performed at time of return appointment  F/U PCP Dr. Lisette Grinder III for evaliation of  BP and general medical  condition  F/U surgical evaluation as discussed  F/U neurological evaluation. May consider [facility] EMG studies and other studies pending follow-up evaluations  F/U Dr.Reddy as discussed   May consider radiofrequency rhizolysis or intraspinal procedures pending response to present treatment and F/U evaluation   Patient to call [facility] should patient have concerns prior to scheduled return appointmentCeliac Plexus Block Patient Information  Description: The celiac plexus is a group of nerves which are part of the sympathetic nervous system.  These nerves supply organs in the abdomen and pelvis.  Specific organs supplied with sensation by the celiac plexus include the stomach, liver, gallbladder, pancreas, kidneys and part of the gut.   The celiac plexus is located on both sides of the aorta at approximately the level of the first lumbar vertebral body.  The block will be performed with you lying on your abdomen with a pillow underneath.  Using direct x-ray guidance, the celiac plexus will be located on both sides of the spine.  Numbing medicine will be used to deaden the skin prior to needle insertion.  In most cases, a small amount of sedation can be given by IV prior to the numbing medicine.  Two small needles will be place near the celiac plexus and local anesthetic and steroid will be injected.  The entire block usually last about 15-25 minutes.  Conditions which may be treated by celiac plexus block:   Acute and chronic pancreatitis  Pain from liver or pancreatic cancer  Pain from Crohn's disease  Other types of abdominal or flank pain  Preparation for the injection:  1. Do not eat any solid food or dairy products within 8 hours of your appointment. 2. You may  drink clear liquids up to 3 hours before appointment.  Clear liquids include water, black coffee, juice or soda.  No milk or cream please. 3. You may take your regular medication, including pain medications, with a sip of water before your appointment.  Diabetics should hold regular insulin (if taken separately) and take 1/2 normal NPH dose in the morning of the procedure.  Carry some sugar containing items with you to your appointment. 4. A driver must accompany you and be prepared to drive you home after your procedure. 5. Bring all your current medications with you. 6. An IV may be inserted and sedation may be given at the discretion of the physician. 7. A blood pressure cuff, EKG, and other monitors will often be applied during the procedure.  Some patients may need to have extra oxygen administered for a short period. 8. You will be asked to provide medical information, including your allergies and medications, prior to the procedure.  We must know immediately if you are taking blood thinners (like Coumadin/Warfarin) or if you are allergic to IV iodine contrast (dye).  We must know if you could possible be pregnant.  Possible side-effects:   Bleeding from needle site or deeper  Infection (rarre, can require surgery)  Nerve injury (rare)  Numbness & tingling (temporary)  Collapsed lung (rare)  Spinal headache ( a headache worse with upright posture)  Light-headedness (temporary)  Pain at injection site (several days)  Decreased blood pressure (temporary)  Weakness in legs (temporary)  Seizure or other drug reaction (rare)  Call if you experience:  Fever/chills associated with headache or increased back/neck pain  Headache worsened by an upright position  New onset weakness or numbness of an extremity below the injection site.  Hives or difficulty breathing (go to the emergency room)  Inflammation or drainage at the injection site.  New onset diarrhea lasting more  than 2 weeks.  New symptoms which are concerning to you  Please note:  If effective, we will often do a series of 2-3 injections spaced 3-6 weeks apart to maximally decrease your pain.  If initial series is effective, you may be a candidate for a more permanent block of the celiac plexus. .  If you have questions, please call [phone] [place]  What are the risk, side effects and possible complications? Generally speaking, most procedures are safe.  However, with any procedure there are risks, side effects, and the possibility of complications.  The risks and complications are dependent upon the sites that are lesioned, or the type of nerve block to be performed.  The closer the procedure is to the spine, the more serious the risks are.  Great care is taken when placing the radio frequency needles, block needles or lesioning probes, but sometimes complications can occur. 1. Infection: Any time there is an injection through the skin, there is a risk of infection.  This is why sterile conditions are used for these blocks.  There are four possible types of infection. 1. Localized skin infection. 2. Central Nervous System Infection-This can be in the form of Meningitis, which can be deadly. 3. Epidural Infections-This can be in the form of an epidural abscess, which can cause pressure inside of the spine, causing compression of the spinal cord with subsequent paralysis. This would require an emergency surgery to decompress, and there are no guarantees that the patient would recover from the paralysis. 4. Discitis-This is an infection of the intervertebral discs.  It occurs in about 1% of discography procedures.  It is difficult to treat and it may lead to surgery.        2. Pain: the needles have to go through skin and soft tissues, will cause soreness.       3. Damage to internal structures:  The nerves to be lesioned  may be near blood vessels or    other nerves which can be potentially damaged.       4. Bleeding: Bleeding is more common if the patient is taking blood thinners such as  aspirin, Coumadin, Ticiid, Plavix, etc., or if he/she have some genetic predisposition  such as hemophilia. Bleeding into the spinal canal can cause compression of the spinal  cord with subsequent paralysis.  This would require an emergency surgery to  decompress and there are no guarantees that the patient would recover from the  paralysis.       5. Pneumothorax:  Puncturing of a lung is a possibility, every time a needle is introduced in  the area of the chest or upper back.  Pneumothorax refers to free air around the  collapsed lung(s), inside of the thoracic cavity (chest cavity).  Another two possible  complications related to a similar event would include: Hemothorax and Chylothorax.   These are variations of the Pneumothorax, where instead of air around the collapsed  lung(s), you may have blood or chyle, respectively.       6. Spinal headaches: They may occur with any procedures in the area of the  spine.       7. Persistent CSF (Cerebro-Spinal Fluid) leakage: This is a rare problem, but may occur  with prolonged intrathecal or epidural catheters either due to the formation of a fistulous  track or a dural tear.       8. Nerve damage: By working so close to the spinal cord, there is always a possibility of  nerve damage, which could be as serious as a permanent spinal cord injury with  paralysis.       9. Death:  Although rare, severe deadly allergic reactions known as "Anaphylactic  reaction" can occur to any of the medications used.      10. Worsening of the symptoms:  We can always make thing worse.  What are the chances of something like this happening? Chances of any of this occuring are extremely low.  By statistics, you have more of a chance of getting killed in a motor vehicle accident: while driving to the hospital than  any of the above occurring .  Nevertheless, you should be aware that they are possibilities.  In general, it is similar to taking a shower.  Everybody knows that you can slip, hit your head and get killed.  Does that mean that you should not shower again?  Nevertheless always keep in mind that statistics do not mean anything if you happen to be on the wrong side of them.  Even if a procedure has a 1 (one) in a 1,000,000 (million) chance of going wrong, it you happen to be that one..Also, keep in mind that by statistics, you have more of a chance of having something go wrong when taking medications.  Who should not have this procedure? If you are on a blood thinning medication (e.g. Coumadin, Plavix, see list of "Blood Thinners"), or if you have an active infection going on, you should not have the procedure.  If you are taking any blood thinners, please inform your physician.  How should I prepare for this procedure?  Do not eat or drink anything at least six hours prior to the procedure.  Bring a driver with you .  It cannot be a taxi.  Come accompanied by an adult that can drive you back, and that is strong enough to help you if your legs get weak or numb from the local anesthetic.  Take all of your medicines the morning of the procedure with just enough water to swallow them.  If you have diabetes, make sure that you are scheduled to have your procedure done first thing in the morning, whenever possible.  If you have diabetes, take only half of your insulin dose and notify our nurse that you have done so as soon as you arrive at the clinic.  If you are diabetic, but only take blood sugar pills (oral hypoglycemic), then do not take them on the morning of your procedure.  You may take them after you have had the procedure.  Do not take aspirin or any aspirin-containing medications, at least eleven (11) days prior to the procedure.  They may prolong bleeding.  Wear loose fitting clothing that  may be easy to take off and that you would not mind if it got stained with Betadine or blood.  Do not wear any jewelry or perfume  Remove any nail coloring.  It will interfere with some of our monitoring equipment.  NOTE: Remember that this is not meant to be interpreted as a complete list of all possible complications.  Unforeseen problems may occur.  BLOOD THINNERS The following drugs contain aspirin or other products, which can cause increased bleeding during surgery and should not be taken for 2 weeks prior to and 1 week after surgery.  If you should need take something for relief of minor pain, you may take acetaminophen which is found in Tylenol,m Datril, Anacin-3 and Panadol. It is not blood thinner. The products listed below are.  Do not take any of the products listed below in addition to any listed on your instruction sheet.  A.P.C or A.P.C with Codeine Codeine Phosphate Capsules #3 Ibuprofen Ridaura  ABC compound Congesprin Imuran rimadil  Advil Cope Indocin Robaxisal  Alka-Seltzer Effervescent Pain Reliever and Antacid Coricidin or Coricidin-D  Indomethacin Rufen  Alka-Seltzer plus Cold Medicine Cosprin Ketoprofen S-A-C Tablets  Anacin Analgesic Tablets or Capsules Coumadin Korlgesic Salflex  Anacin Extra Strength Analgesic tablets or capsules CP-2 Tablets Lanoril Salicylate  Anaprox Cuprimine Capsules Levenox Salocol  Anexsia-D Dalteparin Magan Salsalate  Anodynos Darvon compound Magnesium Salicylate Sine-off  Ansaid Dasin Capsules Magsal Sodium Salicylate  Anturane Depen Capsules Marnal Soma  APF Arthritis pain formula Dewitt's Pills Measurin Stanback  Argesic Dia-Gesic Meclofenamic Sulfinpyrazone  Arthritis Bayer Timed Release Aspirin Diclofenac Meclomen Sulindac  Arthritis pain formula Anacin Dicumarol Medipren Supac  Analgesic (Safety coated) Arthralgen Diffunasal Mefanamic Suprofen  Arthritis Strength Bufferin Dihydrocodeine Mepro Compound Suprol  Arthropan liquid  Dopirydamole Methcarbomol with Aspirin Synalgos  ASA tablets/Enseals Disalcid Micrainin Tagament  Ascriptin Doan's Midol Talwin  Ascriptin A/D Dolene Mobidin Tanderil  Ascriptin Extra Strength Dolobid Moblgesic Ticlid  Ascriptin with Codeine Doloprin or Doloprin with Codeine Momentum Tolectin  Asperbuf Duoprin Mono-gesic Trendar  Aspergum Duradyne Motrin or Motrin IB Triminicin  Aspirin plain, buffered or enteric coated Durasal Myochrisine Trigesic  Aspirin Suppositories Easprin Nalfon Trillsate  Aspirin with Codeine Ecotrin Regular or Extra Strength Naprosyn Uracel  Atromid-S Efficin Naproxen Ursinus  Auranofin Capsules Elmiron Neocylate Vanquish  Axotal Emagrin Norgesic Verin  Azathioprine Empirin or Empirin with Codeine Normiflo Vitamin E  Azolid Emprazil Nuprin Voltaren  Bayer Aspirin plain, buffered or children's or timed BC Tablets or powders Encaprin Orgaran Warfarin Sodium  Buff-a-Comp Enoxaparin Orudis Zorpin  Buff-a-Comp with Codeine Equegesic Os-Cal-Gesic   Buffaprin Excedrin plain, buffered or Extra Strength Oxalid   Bufferin Arthritis Strength Feldene Oxphenbutazone   Bufferin plain or Extra Strength Feldene Capsules Oxycodone with Aspirin   Bufferin with Codeine Fenoprofen Fenoprofen Pabalate or Pabalate-SF   Buffets II Flogesic Panagesic   Buffinol plain or Extra Strength Florinal or Florinal with Codeine Panwarfarin   Buf-Tabs Flurbiprofen Penicillamine   Butalbital Compound Four-way cold tablets Penicillin   Butazolidin Fragmin Pepto-Bismol   Carbenicillin Geminisyn Percodan   Carna Arthritis Reliever Geopen Persantine   Carprofen Gold's salt Persistin   Chloramphenicol Goody's Phenylbutazone   Chloromycetin Haltrain Piroxlcam   Clmetidine heparin Plaquenil   Cllnoril Hyco-pap Ponstel   Clofibrate Hydroxy chloroquine Propoxyphen         Before stopping any of these medications, be sure to consult the physician who ordered them.  Some, such as Coumadin (Warfarin)  are ordered to prevent or treat serious conditions such as "deep thrombosis", "pumonary embolisms", and other heart problems.  The amount of time that you may need off of the medication may also vary with the medication and the reason for which you were taking it.  If you are taking any of these medications, please make sure you notify your pain physician before you undergo any procedures.

## 2015-05-17 ENCOUNTER — Emergency Department
Admission: EM | Admit: 2015-05-17 | Discharge: 2015-05-17 | Disposition: A | Discharge status: Home / Self Care | Payer: BLUE CROSS/BLUE SHIELD | Attending: Emergency Medicine | Admitting: Emergency Medicine

## 2015-05-17 ENCOUNTER — Emergency Department: Payer: BLUE CROSS/BLUE SHIELD

## 2015-05-17 ENCOUNTER — Encounter: Payer: Self-pay | Admitting: Medical Oncology

## 2015-05-17 DIAGNOSIS — F17210 Nicotine dependence, cigarettes, uncomplicated: Secondary | ICD-10-CM | POA: Insufficient documentation

## 2015-05-17 DIAGNOSIS — F329 Major depressive disorder, single episode, unspecified: Secondary | ICD-10-CM | POA: Diagnosis not present

## 2015-05-17 DIAGNOSIS — R197 Diarrhea, unspecified: Secondary | ICD-10-CM | POA: Diagnosis not present

## 2015-05-17 DIAGNOSIS — M545 Low back pain: Secondary | ICD-10-CM | POA: Diagnosis present

## 2015-05-17 LAB — CBC WITH DIFFERENTIAL/PLATELET
BASOS ABS: 0 10*3/uL (ref 0–0.1)
BASOS PCT: 1 %
Eosinophils Absolute: 0.1 10*3/uL (ref 0–0.7)
Eosinophils Relative: 1 %
HEMATOCRIT: 36.4 % (ref 35.0–47.0)
HEMOGLOBIN: 12.8 g/dL (ref 12.0–16.0)
LYMPHS ABS: 3.5 10*3/uL (ref 1.0–3.6)
Lymphocytes Relative: 36 %
MCH: 33.2 pg (ref 26.0–34.0)
MCHC: 35.2 g/dL (ref 32.0–36.0)
MCV: 94.1 fL (ref 80.0–100.0)
MONOS PCT: 7 %
Monocytes Absolute: 0.7 10*3/uL (ref 0.2–0.9)
Neutro Abs: 5.5 10*3/uL (ref 1.4–6.5)
Neutrophils Relative %: 55 %
Platelets: 285 10*3/uL (ref 150–440)
RBC: 3.87 MIL/uL (ref 3.80–5.20)
RDW: 15.4 % — ABNORMAL HIGH (ref 11.5–14.5)
WBC: 9.9 10*3/uL (ref 3.6–11.0)

## 2015-05-17 LAB — BASIC METABOLIC PANEL
Anion gap: 7 (ref 5–15)
CO2: 23 mmol/L (ref 22–32)
CREATININE: 0.73 mg/dL (ref 0.44–1.00)
Calcium: 8.6 mg/dL — ABNORMAL LOW (ref 8.9–10.3)
Chloride: 103 mmol/L (ref 101–111)
GFR calc Af Amer: >60 mL/min (ref >60)
GLUCOSE: 90 mg/dL (ref 65–99)
Potassium: 3.5 mmol/L (ref 3.5–5.1)
SODIUM: 133 mmol/L — AB (ref 135–145)

## 2015-05-17 MED ORDER — HYDROCOD POLST-CPM POLST ER 10-8 MG/5ML PO SUER: 5.0000 mL | Freq: Two times a day (BID) | ORAL | Status: DC | PRN

## 2015-05-17 MED ORDER — METRONIDAZOLE 500 MG PO TABS: 500.0000 mg | ORAL_TABLET | Freq: Three times a day (TID) | ORAL | Status: AC

## 2015-05-17 MED ORDER — HYDROCORTISONE 2.5 % RE CREA: TOPICAL_CREAM | RECTAL | Status: DC

## 2015-05-17 NOTE — ED Notes (Addendum)
See triage note  Having pain to lower back  Developed cough yesterday   Non prod  No fever  Has been on antibiotics for the past 10 days   Then developed some diarrhea

## 2015-05-17 NOTE — ED Notes (Signed)
Pt reports that she began yesterday with cough and while she was coughing she thinks that she pulled a muscle in her lower back. Pt also states that she has had problems with her hemorrhoids. Pt currently taking abx for URI.

## 2015-05-17 NOTE — Discharge Instructions (Signed)

## 2015-05-17 NOTE — ED Provider Notes (Signed)
[facility] Emergency Department Provider Note  ____________________________________________  Time seen: Approximately 1:35 PM  I have reviewed the triage vital signs and the nursing notes.   HISTORY  Chief Complaint Cough and Back Pain    HPI Meagan Bridges is a 50 y.o. female who presents with multiple complaints. Patient states that she's been treated with a URI for the last 20 days. She reports taking 2 separate antibiotics. Now complains of having copious watery diarrhea with abdominal cramping. In addition she complains of having low back pain secondary to continued coughing. Past medical history significant for GERD. Patient was seen by her PCP twice and sent over here today for further workup and evaluation. Patient additionally reports having a flareup of her hemorrhoids but states that that doesn't seem to be a problem today. She isn't every day 2 pack per day smoker. Unable to eat foods or keep them down secondary to recurrent diarrhea.   Past Medical History  Diagnosis Date  . Back pain   . Migraines   . Depression   . GERD (gastroesophageal reflux disease)     Patient Active Problem List   Diagnosis Date Noted  . Degenerative joint disease (DJD) of hip 05/15/2015  . Greater trochanteric bursitis 05/15/2015  . Bilateral occipital neuralgia 08/22/2014  . DDD (degenerative disc disease), lumbar 06/22/2014  . Facet syndrome, lumbar 06/22/2014  . Bilateral lumbar radiculopathy 06/22/2014  . Degenerative joint disease of sacroiliac joint 06/22/2014    Past Surgical History  Procedure Laterality Date  . Cholecystectomy    . Uterine fibroid surgery  2000  . Lumbar disc surgery  2002    Current Outpatient Rx  Name  Route  Sig  Dispense  Refill  . ALPRAZolam (XANAX) 0.5 MG tablet   Oral   Take 0.5 mg by mouth 3 (three) times daily as needed for anxiety.          Marland Kitchen amitriptyline (ELAVIL) 25 MG tablet   Oral   Take 50 mg by mouth at  bedtime.      1   . Armodafinil 250 MG tablet   Oral   Take 400 mg by mouth daily.          Marland Kitchen aspirin 81 MG tablet   Oral   Take 81 mg by mouth daily. Reported on 04/24/2015         . Biotin 5000 MCG TABS   Oral   Take 1 tablet by mouth daily.         . chlorpheniramine-HYDROcodone (TUSSIONEX PENNKINETIC ER) 10-8 MG/5ML SUER   Oral   Take 5 mLs by mouth every 12 (twelve) hours as needed for cough.   120 mL   0   . diltiazem (DILACOR XR) 180 MG 24 hr capsule   Oral   Take 120 mg by mouth daily.          . DULoxetine (CYMBALTA) 60 MG capsule   Oral   Take 60 mg by mouth 2 (two) times daily.         . metFORMIN (GLUCOPHAGE) 500 MG tablet   Oral   Take 500 mg by mouth 2 (two) times daily.          . metroNIDAZOLE (FLAGYL) 500 MG tablet   Oral   Take 1 tablet (500 mg total) by mouth 3 (three) times daily.   30 tablet   0   . omeprazole (PRILOSEC) 20 MG capsule   Oral   Take 20  mg by mouth daily.         Marland Kitchen perphenazine (TRILAFON) 4 MG tablet   Oral   Take 4 mg by mouth at bedtime.         . topiramate (TOPAMAX) 100 MG tablet   Oral   Take 100 mg by mouth at bedtime. 2 tabs at night           Allergies Latex; Niacin; and Tape  Family History  Problem Relation Age of Onset  . Diabetes Mother   . Kidney disease Mother   . Miscarriages / [place] Mother   . Diabetes Father   . Early death Father   . Hearing loss Father   . Heart disease Father   . Hyperlipidemia Father   . Hypertension Father   . Learning disabilities Father     Social History Social History  Substance Use Topics  . Smoking status: Current Every Day Smoker -- 2.00 packs/day    Types: Cigarettes  . Smokeless tobacco: None  . Alcohol Use: No    Review of Systems Constitutional: No fever/chills Eyes: No visual changes. ENT: No sore throat. Cardiovascular: Denies chest pain. Respiratory: Denies shortness of breath. Gastrointestinal: Positive abdominal pain.   No nausea, no vomiting.  Positive diarrhea.  No constipation. Genitourinary: Negative for dysuria. Musculoskeletal: Negative for back pain. Skin: Negative for rash. Neurological: Negative for headaches, focal weakness or numbness.  10-point ROS otherwise negative.  ____________________________________________   PHYSICAL EXAM:  VITAL SIGNS: ED Triage Vitals  Enc Vitals Group     BP 05/17/15 1307 100/77 mmHg     Pulse Rate 05/17/15 1307 84     Resp 05/17/15 1307 18     Temp 05/17/15 1307 97.8 F (36.6 C)     Temp Source 05/17/15 1307 Oral     SpO2 05/17/15 1307 98 %     Weight 05/17/15 1307 170 lb (77.111 kg)     Height 05/17/15 1307 5\' 3"  (1.6 m)     Head Cir --      Peak Flow --      Pain Score 05/17/15 1326 8     Pain Loc --      Pain Edu? --      Excl. in [place]? --     Constitutional: Alert and oriented. Well appearing and in no acute distress. Eyes: Conjunctivae are normal. PERRL. EOMI. Head: Atraumatic. Nose: No congestion/rhinnorhea. Mouth/Throat: Mucous membranes are moist.  Oropharynx non-erythematous. Neck: No stridor.   Cardiovascular: Normal rate, regular rhythm. Grossly normal heart sounds.  Good peripheral circulation. Respiratory: Normal respiratory effort.  No retractions. Lungs CTAB. Gastrointestinal: Soft and nontender. No distention. No abdominal bruits. No CVA tenderness. Musculoskeletal: No lower extremity tenderness nor edema.  No joint effusions. Neurologic:  Normal speech and language. No gross focal neurologic deficits are appreciated. No gait instability. Skin:  Skin is warm, dry and intact. No rash noted. Psychiatric: Mood and affect are normal. Speech and behavior are normal.  ____________________________________________   LABS (all labs ordered are listed, but only abnormal results are displayed)  Labs Reviewed  BASIC METABOLIC PANEL - Abnormal; Notable for the following:    Sodium 133 (*)    BUN <5 (*)    Calcium 8.6 (*)    All other  components within normal limits  CBC WITH DIFFERENTIAL/PLATELET - Abnormal; Notable for the following:    RDW 15.4 (*)    All other components within normal limits  C DIFFICILE QUICK SCREEN W PCR REFLEX  WBCS, STOOL   ____________________________________________  EKG   ____________________________________________  RADIOLOGY   ____________________________________________   PROCEDURES  Procedure(s) performed: None  Critical Care performed: No  ____________________________________________   INITIAL IMPRESSION / ASSESSMENT AND PLAN / ED COURSE  Pertinent labs & imaging results that were available during my care of the patient were reviewed by me and considered in my medical decision making (see chart for details).  Upper respiratory infection/diarrhea. Concern is for C. difficile. Patient unable to provide stool culture sent with this time. Rx given for Flagyl 500 mg twice a day for 10 days and Tussionex as needed for continued cough. She is to follow-up with her PCP. ____________________________________________   FINAL CLINICAL IMPRESSION(S) / ED DIAGNOSES  Final diagnoses:  Diarrhea, unspecified type     This chart was dictated using voice recognition software/Dragon. Despite best efforts to proofread, errors can occur which can change the meaning. Any change was purely unintentional.   Arlyss Repress, PA-C 05/17/15 [phone]

## 2015-05-22 ENCOUNTER — Ambulatory Visit: Payer: BLUE CROSS/BLUE SHIELD | Admitting: Pain Medicine

## 2015-05-22 ENCOUNTER — Encounter: Payer: Self-pay | Admitting: Pain Medicine

## 2015-05-22 ENCOUNTER — Ambulatory Visit: Payer: BLUE CROSS/BLUE SHIELD | Attending: Pain Medicine | Admitting: Pain Medicine

## 2015-05-22 VITALS — BP 113/62 | HR 94 | Temp 98.0°F | Resp 16 | Ht 63.0 in | Wt 170.0 lb

## 2015-05-22 DIAGNOSIS — M5136 Other intervertebral disc degeneration, lumbar region: Secondary | ICD-10-CM

## 2015-05-22 DIAGNOSIS — M461 Sacroiliitis, not elsewhere classified: Secondary | ICD-10-CM

## 2015-05-22 DIAGNOSIS — M5416 Radiculopathy, lumbar region: Secondary | ICD-10-CM

## 2015-05-22 DIAGNOSIS — M47816 Spondylosis without myelopathy or radiculopathy, lumbar region: Secondary | ICD-10-CM

## 2015-05-22 DIAGNOSIS — M160 Bilateral primary osteoarthritis of hip: Secondary | ICD-10-CM

## 2015-05-22 DIAGNOSIS — M25551 Pain in right hip: Secondary | ICD-10-CM | POA: Insufficient documentation

## 2015-05-22 DIAGNOSIS — M5481 Occipital neuralgia: Secondary | ICD-10-CM

## 2015-05-22 DIAGNOSIS — M47818 Spondylosis without myelopathy or radiculopathy, sacral and sacrococcygeal region: Secondary | ICD-10-CM

## 2015-05-22 DIAGNOSIS — M7060 Trochanteric bursitis, unspecified hip: Secondary | ICD-10-CM

## 2015-05-22 MED ORDER — BUPIVACAINE HCL (PF) 0.25 % IJ SOLN
INTRAMUSCULAR | Status: AC
  Filled 2015-05-22: qty 30

## 2015-05-22 MED ORDER — BUPIVACAINE HCL (PF) 0.25 % IJ SOLN: 30.0000 mL | Freq: Once | INTRAMUSCULAR | Status: DC

## 2015-05-22 MED ORDER — TRIAMCINOLONE ACETONIDE 40 MG/ML IJ SUSP
INTRAMUSCULAR | Status: AC
  Filled 2015-05-22: qty 1

## 2015-05-22 MED ORDER — TRIAMCINOLONE ACETONIDE 40 MG/ML IJ SUSP: 40.0000 mg | Freq: Once | INTRAMUSCULAR | Status: DC

## 2015-05-22 NOTE — Progress Notes (Signed)
   Subjective:    Patient ID: Meagan Bridges, female    DOB: 11/16/1965, 50 y.o.   MRN: [id number]  HPI                                         RIGHT GREATER trochanteric BURSA INJECTION     The patient is a 50 year old female who returns to pain management for further evaluation and treatment of pain involving the right greater trochanteric region. The patient is with reproduction of severe pain with palpation over the right greater trochanteric. There is concern regarding patient's pain being due to right greater trochanteric bursitis.. The risks benefits and expectations of procedure have been discussed and explained to patient who is understanding and wished to proceed with right greater trochanteric bursa injection at this time   Description Of Procedure:  Right Greater Trochanteric Bursa Injection  The patient assumed the lateral decubitus position with EKG, blood pressure, pulse, and pulse oximetry monitoring all in place. Betadine prep of proposed entry site was accomplished and identification of landmarks for right greater trochanteric bursa injection were identified a 22-gauge needle was inserted in the region of the right greater trochanter and a total of 10 cc of 0.25% bupivacaine with Kenalog was injected for right greater trochanteric bursa injection. Needle was removed. He was reinserted an additional 10 cc of 0.25% bupivacaine with Kenalog was injected for right greater trochanteric bursa injection.  A total of 20 mg of Kenalog was utilized for the entire procedure  The patient tolerated procedure well      PLAN   Continue present medication Flexeril and hydrocodone acetaminophen  F/U PCP Dr. Lisette Grinder III for evaliation of  BP and general medical  condition  F/U surgical evaluation as discussed  F/U neurological evaluation. May consider PNCV EMG studies and other studies pending follow-up evaluations  F/U Dr.Reddy as discussed   May consider radiofrequency  rhizolysis or intraspinal procedures pending response to present treatment and F/U evaluation   Patient to call Pain Management Center should patient have concerns prior to scheduled return appointment  Review of Systems     Objective:   Physical Exam        Assessment & Plan:

## 2015-05-22 NOTE — Progress Notes (Signed)
Currently on antibiotic and cough med for "sinus infection"

## 2015-05-22 NOTE — Progress Notes (Signed)
Safety precautions to be maintained throughout the outpatient stay will include: orient to surroundings, keep bed in low position, maintain call bell within reach at all times, provide assistance with transfer out of bed and ambulation.  

## 2015-05-22 NOTE — Patient Instructions (Addendum)
PLAN   Continue present medication Flexeril and hydrocodone acetaminophen  F/U PCP Dr. Lisette Grinder III for evaliation of  BP and general medical  condition  F/U surgical evaluation as discussed  F/U neurological evaluation. May consider [facility] EMG studies and other studies pending follow-up evaluations  F/U Dr.Reddy as discussed   May consider radiofrequency rhizolysis or intraspinal procedures pending response to present treatment and F/U evaluation   Patient to call Pain Management Center should patient have concerns prior to scheduled return appointment You may apply ice to the area today, 15 minutes on, 15 minutes off. Tomorrow, you may apply heat.

## 2015-05-23 ENCOUNTER — Telehealth: Payer: Self-pay | Admitting: *Deleted

## 2015-05-23 NOTE — Telephone Encounter (Signed)
Left voicemail to call our office if there are questions or concerns re; procedure on yesterday.

## 2015-06-12 ENCOUNTER — Encounter: Payer: Self-pay | Admitting: Pain Medicine

## 2015-06-12 ENCOUNTER — Ambulatory Visit: Payer: BLUE CROSS/BLUE SHIELD | Attending: Pain Medicine | Admitting: Pain Medicine

## 2015-06-12 VITALS — BP 127/65 | HR 80 | Temp 97.7°F | Resp 15 | Ht 63.0 in | Wt 170.0 lb

## 2015-06-12 DIAGNOSIS — M47896 Other spondylosis, lumbar region: Secondary | ICD-10-CM | POA: Insufficient documentation

## 2015-06-12 DIAGNOSIS — M160 Bilateral primary osteoarthritis of hip: Secondary | ICD-10-CM

## 2015-06-12 DIAGNOSIS — M1610 Unilateral primary osteoarthritis, unspecified hip: Secondary | ICD-10-CM | POA: Diagnosis not present

## 2015-06-12 DIAGNOSIS — M47818 Spondylosis without myelopathy or radiculopathy, sacral and sacrococcygeal region: Secondary | ICD-10-CM

## 2015-06-12 DIAGNOSIS — M5126 Other intervertebral disc displacement, lumbar region: Secondary | ICD-10-CM | POA: Diagnosis not present

## 2015-06-12 DIAGNOSIS — M5416 Radiculopathy, lumbar region: Secondary | ICD-10-CM

## 2015-06-12 DIAGNOSIS — M5136 Other intervertebral disc degeneration, lumbar region: Secondary | ICD-10-CM

## 2015-06-12 DIAGNOSIS — M545 Low back pain: Secondary | ICD-10-CM | POA: Diagnosis present

## 2015-06-12 DIAGNOSIS — M7060 Trochanteric bursitis, unspecified hip: Secondary | ICD-10-CM | POA: Diagnosis not present

## 2015-06-12 DIAGNOSIS — M533 Sacrococcygeal disorders, not elsewhere classified: Secondary | ICD-10-CM | POA: Diagnosis not present

## 2015-06-12 DIAGNOSIS — M5481 Occipital neuralgia: Secondary | ICD-10-CM

## 2015-06-12 DIAGNOSIS — M461 Sacroiliitis, not elsewhere classified: Secondary | ICD-10-CM

## 2015-06-12 DIAGNOSIS — M47816 Spondylosis without myelopathy or radiculopathy, lumbar region: Secondary | ICD-10-CM

## 2015-06-12 MED ORDER — CYCLOBENZAPRINE HCL 10 MG PO TABS: ORAL_TABLET | ORAL | Status: DC

## 2015-06-12 MED ORDER — HYDROCODONE-ACETAMINOPHEN 5-325 MG PO TABS: ORAL_TABLET | ORAL | Status: DC

## 2015-06-12 NOTE — Patient Instructions (Signed)
PLAN   Continue present medication Flexeril and hydrocodone acetaminophen  F/U PCP Dr. Lisette Grinder III for evaliation of  BP and general medical  condition  F/U surgical evaluation as discussed  F/U neurological evaluation. May consider PN[facility] EMG studies and other studies pending follow-up evaluations  F/U Dr.Reddy as discussed   May consider radiofrequency rhizolysis or intraspinal procedures pending response to present treatment and F/U evaluation   Patient to call Pain Management Center should patient have concerns prior to scheduled return appointment

## 2015-06-12 NOTE — Progress Notes (Signed)
Safety precautions to be maintained throughout the outpatient stay will include: orient to surroundings, keep bed in low position, maintain call bell within reach at all times, provide assistance with transfer out of bed and ambulation.  

## 2015-06-12 NOTE — Progress Notes (Signed)
   Subjective:    Patient ID: Meagan Bridges, female    DOB: Jun 16, 1965, 50 y.o.   MRN: [id number]  HPI  The patient is a 50 year old female who returns to pain management for further evaluation and treatment of pain involving the lower back lower extremity region with occasional headaches as well the patient has had greater trochanteric bursa injection and has had significant improvement of pain involving the greater trochanteric region The patient denies any trauma change in events of daily living the cost change in symptomatology. The patient continues Flexeril and hydrocodone acetaminophen. We have advised patient to avoid pressure being applied to the greater trochanteric region. The patient will call pain management should they be returned of any significant pain prior to scheduled return appointment. Patient states she is doing quite well at this time. All agreed to suggested treatment plan  Review of Systems     Objective:   Physical Exam  There was tenderness of the splenius capitis and occipitalis musculature region of mild to moderate degree with palpation over the cervical facet cervical paraspinal musculature region reproducing mild to moderate discomfort. Palpation over the region of the acromioclavicular and glenohumeral joint regions were with tenderness to palpation of minimal degree and patient appeared to be with bilaterally equal grip strength with Tinel and Phalen's maneuver reproducing minimal discomfort. Palpation over the region of the thoracic facet thoracic paraspinal musculature region was with no crepitus of the thoracic region and was with evidence of muscle spasms involving the lower thoracic paraspinal must which region. Palpation over the region of the lumbar paraspinal musculatures and lumbar facet region was attends to palpation of moderate degree with lateral bending rotation extension and palpation of the lumbar facets reproducing moderate discomfort. There was mild  to moderate tenderness of the PSIS and PII S regions. There was mild tenderness to palpation of the greater trochanteric region and iliotibial band region. Straight leg raise was tolerated to 30 without increased pain with dorsiflexion noted. No sensory deficit or dermatomal distribution detected . There was negative clonus negative Homans. Abdomen nontender with no costovertebral angle tenderness noted    Assessment & Plan:    Degenerative changes lumbar spine  L4-L5 disc protrusion, with narrowing of the left neural foramen at this level, L4-5 annular disc bulging and left paracentral disc protrusion.   Lumbar facet syndrome   Lumbar radiculopathy   sacroiliac joint dysfunction  Greater trochanteric bursitis  Degenerative joint disease of hip       PLAN   Continue present medication Flexeril and hydrocodone acetaminophen  F/U PCP Dr. Lisette Grinder III for evaliation of  BP and general medical  condition  F/U surgical evaluation as discussed  F/U neurological evaluation. May consider [facility] EMG studies and other studies pending follow-up evaluations  F/U Dr.Reddy as discussed   May consider radiofrequency rhizolysis or intraspinal procedures pending response to present treatment and F/U evaluation   Patient to call Pain Management Center should patient have concerns prior to scheduled return appointment

## 2015-06-17 LAB — TOXASSURE SELECT 13 (MW), URINE

## 2015-06-19 NOTE — Progress Notes (Signed)
Quick Note:  Reviewed. ______ 

## 2015-07-13 ENCOUNTER — Ambulatory Visit: Payer: BLUE CROSS/BLUE SHIELD | Admitting: Pain Medicine

## 2015-07-14 NOTE — Progress Notes (Unsigned)
   Subjective:    Patient ID: Meagan Bridges, female    DOB: Sep 24, 1965, 50 y.o.   MRN: [id number]  HPI  Patient did not come for appointment  Review of Systems     Objective:   Physical Exam        Assessment & Plan:

## 2015-08-02 ENCOUNTER — Telehealth: Payer: Self-pay

## 2015-08-02 ENCOUNTER — Other Ambulatory Visit: Payer: Self-pay | Admitting: Pain Medicine

## 2015-08-02 DIAGNOSIS — M5136 Other intervertebral disc degeneration, lumbar region: Secondary | ICD-10-CM

## 2015-08-02 DIAGNOSIS — M160 Bilateral primary osteoarthritis of hip: Secondary | ICD-10-CM

## 2015-08-02 DIAGNOSIS — M5481 Occipital neuralgia: Secondary | ICD-10-CM

## 2015-08-02 DIAGNOSIS — M47816 Spondylosis without myelopathy or radiculopathy, lumbar region: Secondary | ICD-10-CM

## 2015-08-02 DIAGNOSIS — M5134 Other intervertebral disc degeneration, thoracic region: Secondary | ICD-10-CM

## 2015-08-02 DIAGNOSIS — M5416 Radiculopathy, lumbar region: Secondary | ICD-10-CM

## 2015-08-02 DIAGNOSIS — M47818 Spondylosis without myelopathy or radiculopathy, sacral and sacrococcygeal region: Secondary | ICD-10-CM

## 2015-08-02 DIAGNOSIS — M7060 Trochanteric bursitis, unspecified hip: Secondary | ICD-10-CM

## 2015-08-02 DIAGNOSIS — G588 Other specified mononeuropathies: Secondary | ICD-10-CM

## 2015-08-02 DIAGNOSIS — M461 Sacroiliitis, not elsewhere classified: Secondary | ICD-10-CM

## 2015-08-02 DIAGNOSIS — M51369 Other intervertebral disc degeneration, lumbar region without mention of lumbar back pain or lower extremity pain: Secondary | ICD-10-CM

## 2015-08-02 NOTE — Telephone Encounter (Signed)
Nurses Please have patient see her primary care physician. The pain could be due to shingles for due to intraspinal spinal lesion of the thoracic area such as a vertebral body fracture. The patient denies any trauma Will consider patient for intercostal nerve block or other treatment after patient sees primary care physician or go was to the emergency department  Please have patient call me after she sees her primary care physician R goes to the emergency department   Thank you

## 2015-08-02 NOTE — Telephone Encounter (Signed)
Patient advised to see PCP

## 2015-08-02 NOTE — Telephone Encounter (Signed)
New area of pain at bra strap area. No injury. Dr. Primus Bravo, please advise.Marland Kitchen

## 2015-08-02 NOTE — Telephone Encounter (Signed)
Pt is having pain in the middle of her back where bra goes across. Pain started yesterday  [phone]

## 2015-08-02 NOTE — Telephone Encounter (Signed)
Left message to see primary care and to call us back if the pt has shingles for injection

## 2015-08-03 ENCOUNTER — Other Ambulatory Visit: Payer: Self-pay | Admitting: Pain Medicine

## 2015-08-07 ENCOUNTER — Ambulatory Visit: Payer: BLUE CROSS/BLUE SHIELD | Attending: Pain Medicine | Admitting: Pain Medicine

## 2015-08-07 ENCOUNTER — Encounter: Payer: Self-pay | Admitting: Pain Medicine

## 2015-08-07 VITALS — BP 117/62 | HR 75 | Temp 97.2°F | Resp 29 | Ht 63.0 in | Wt 170.0 lb

## 2015-08-07 DIAGNOSIS — M546 Pain in thoracic spine: Secondary | ICD-10-CM | POA: Diagnosis present

## 2015-08-07 DIAGNOSIS — M5134 Other intervertebral disc degeneration, thoracic region: Secondary | ICD-10-CM

## 2015-08-07 DIAGNOSIS — M5136 Other intervertebral disc degeneration, lumbar region: Secondary | ICD-10-CM

## 2015-08-07 DIAGNOSIS — M7060 Trochanteric bursitis, unspecified hip: Secondary | ICD-10-CM

## 2015-08-07 DIAGNOSIS — M47894 Other spondylosis, thoracic region: Secondary | ICD-10-CM | POA: Insufficient documentation

## 2015-08-07 DIAGNOSIS — M5481 Occipital neuralgia: Secondary | ICD-10-CM

## 2015-08-07 DIAGNOSIS — M160 Bilateral primary osteoarthritis of hip: Secondary | ICD-10-CM

## 2015-08-07 DIAGNOSIS — M5416 Radiculopathy, lumbar region: Secondary | ICD-10-CM

## 2015-08-07 DIAGNOSIS — M461 Sacroiliitis, not elsewhere classified: Secondary | ICD-10-CM

## 2015-08-07 DIAGNOSIS — G588 Other specified mononeuropathies: Secondary | ICD-10-CM | POA: Insufficient documentation

## 2015-08-07 DIAGNOSIS — M62830 Muscle spasm of back: Secondary | ICD-10-CM | POA: Diagnosis not present

## 2015-08-07 DIAGNOSIS — M47814 Spondylosis without myelopathy or radiculopathy, thoracic region: Secondary | ICD-10-CM

## 2015-08-07 DIAGNOSIS — M47818 Spondylosis without myelopathy or radiculopathy, sacral and sacrococcygeal region: Secondary | ICD-10-CM

## 2015-08-07 DIAGNOSIS — M47816 Spondylosis without myelopathy or radiculopathy, lumbar region: Secondary | ICD-10-CM

## 2015-08-07 MED ORDER — BUPIVACAINE HCL (PF) 0.25 % IJ SOLN
30.0000 mL | Freq: Once | INTRAMUSCULAR | Status: AC
  Administered 2015-08-07: 30 mL
  Filled 2015-08-07: qty 30

## 2015-08-07 MED ORDER — HYDROCODONE-ACETAMINOPHEN 5-325 MG PO TABS: ORAL_TABLET | ORAL | Status: DC

## 2015-08-07 MED ORDER — FENTANYL CITRATE (PF) 100 MCG/2ML IJ SOLN
100.0000 ug | Freq: Once | INTRAMUSCULAR | Status: AC
  Administered 2015-08-07: 50 ug via INTRAVENOUS
  Filled 2015-08-07: qty 2

## 2015-08-07 MED ORDER — ORPHENADRINE CITRATE 30 MG/ML IJ SOLN
60.0000 mg | Freq: Once | INTRAMUSCULAR | Status: AC
  Administered 2015-08-07: 60 mg via INTRAMUSCULAR
  Filled 2015-08-07: qty 2

## 2015-08-07 MED ORDER — CYCLOBENZAPRINE HCL 10 MG PO TABS: ORAL_TABLET | ORAL | Status: DC

## 2015-08-07 MED ORDER — MIDAZOLAM HCL 5 MG/5ML IJ SOLN
5.0000 mg | Freq: Once | INTRAMUSCULAR | Status: AC
  Administered 2015-08-07: 4 mg via INTRAVENOUS
  Filled 2015-08-07: qty 5

## 2015-08-07 MED ORDER — LACTATED RINGERS IV SOLN
1000.0000 mL | INTRAVENOUS | Status: DC
  Administered 2015-08-07: 1000 mL via INTRAVENOUS

## 2015-08-07 MED ORDER — TRIAMCINOLONE ACETONIDE 40 MG/ML IJ SUSP
40.0000 mg | Freq: Once | INTRAMUSCULAR | Status: AC
  Administered 2015-08-07: 40 mg
  Filled 2015-08-07: qty 1

## 2015-08-07 NOTE — Progress Notes (Signed)
Safety precautions to be maintained throughout the outpatient stay will include: orient to surroundings, keep bed in low position, maintain call bell within reach at all times, provide assistance with transfer out of bed and ambulation.  

## 2015-08-07 NOTE — Progress Notes (Signed)
Subjective:    Patient ID: Meagan Bridges, female    DOB: December 14, 1965, 50 y.o.   MRN: [id number]  HPI   PROCEDURE PERFORMED:  Intercostal nerve block.  HISTORY OF PRESENT ILLNESS:  The patient is a 50 y.o. female who returns to the [facility] for further evaluation and treatment of pain involving the midportion of the back. . The patient states that she has severe back spasms of the mid back region with pain occurring in the region of the mid back of severe degree. Patient also has pain involving the lumbar lower extremity region and states that the predominant pain involves the mid back region at this time. The pain has been severely incapacitating interfere with patient ability to perform activities throughout the day. There is concern regarding the patient's pain being due to significant component of intercostal neuralgia. The risks, benefits, and expectations of the procedure have been discussed and explained to the patient who was understanding and in agreement with suggested treatment plan. We will proceed with interventional treatment as discussed.   DESCRIPTION OF PROCEDURE: Intercostal nerve block with IV Versed, IV fentanyl conscious sedation, EKG, blood pressure, pulse, capnography, and pulse oximetry monitoring. The procedure was performed with the patient in the prone position under fluoroscopic guidance.   Intercostal nerve block, right side: With the patient in the prone position, Betadine prep of proposed entry site was performed under fluoroscopic guidance with AP view of the thoracic spine. Under fluoroscopic guidance, a 22 -gauge needle was inserted to contact bone of the 12th rib on the right side after which the needle was repositioned at the inferior border of the 12th rib on the right side under fluoroscopic guidance. Following documentation of needle placement, at the inferior border of the 12th rib on the right side and negative aspiration, a total of 3 mL of  0.25% bupivacaine with Kenalog was injected for right side for 12 rib intercostal nerve block.   INTERCOSTAL NERVE BLOCKS AT T11, T10, T9, T8, and T7 LEVELS: The procedure was performed at these levels as was performed at the previous level, T12, utilizing the same technique and under fluoroscopic guidance  Myoneural block injections of the lumbar paraspinal musculature region Following Betadine prep of proposed entry site a 22-gauge needle was inserted into the lumbar paraspinal musculature region and following negative aspiration 1 cc of 0.25% bupivacaine with Norflex was injected for myoneural block injection of the lumbar paraspinal musculature region times to  The patient tolerated procedure well  A total of 10 mg Kenalog was utilized for the procedure.   PLAN:   1. Medications: We will continue presently prescribed medications. Flexeril and hydrocodone acetaminophen 2. The patient is to follow up with primary care physician Dr. Lisette Grinder  III for further evaluation of blood pressure and general medical condition as discussed. 3. Surgical evaluation. May consider surgical evaluations pending response to treatment 4. Neurological evaluation.. May consider additional studies pending follow-up evaluations 5. May consider the patient for additional studies pending response to treatment and follow-up evaluation. 6. May consider radiofrequency procedures, implantation type procedures and other treatment pending response to treatment and follow-up evaluation. 7. The patient has been advised to adhere to proper body mechanics and to call the Pain Management Center prior to scheduled return appointment should there be significant change in condition or have other concerns regarding condition prior to scheduled return appointment.  The patient is understanding and in agreement with suggested treatment plan.    Review of  Systems     Objective:   Physical Exam        Assessment & Plan:

## 2015-08-07 NOTE — Patient Instructions (Addendum)
PLAN   Continue present medication Flexeril and hydrocodone acetaminophen  F/U PCP Dr. Lisette Grinder III for evaliation of  BP and general medical  condition  F/U surgical evaluation as discussed  F/U neurological evaluation. May consider [facility] EMG studies and other studies pending follow-up evaluations  F/U Dr.Reddy as discussed   May consider radiofrequency rhizolysis or intraspinal procedures pending response to present treatment and F/U evaluation   Patient to call Pain Management Center should patient have concerns prior to scheduled return appointmentPain Management Discharge Instructions  General Discharge Instructions :  If you need to reach your doctor call: Monday-Friday 8:00 am - 4:00 pm at [phone] or toll free [phone].  After clinic hours [phone] to have operator reach doctor.  Bring all of your medication bottles to all your appointments in the pain clinic.  To cancel or reschedule your appointment with Pain Management please remember to call 24 hours in advance to avoid a fee.  Refer to the educational materials which you have been given on: General Risks, I had my Procedure. Discharge Instructions, Post Sedation.  Post Procedure Instructions:  The drugs you were given will stay in your system until tomorrow, so for the next 24 hours you should not drive, make any legal decisions or drink any alcoholic beverages.  You may eat anything you prefer, but it is better to start with liquids then soups and crackers, and gradually work up to solid foods.  Please notify your doctor immediately if you have any unusual bleeding, trouble breathing or pain that is not related to your normal pain.  Depending on the type of procedure that was done, some parts of your body may feel week and/or numb.  This usually clears up by tonight or the next day.  Walk with the use of an assistive device or accompanied by an adult for the 24 hours.  You may use ice on the  affected area for the first 24 hours.  Put ice in a Ziploc bag and cover with a towel and place against area 15 minutes on 15 minutes off.  You may switch to heat after 24 hours.

## 2015-08-08 ENCOUNTER — Telehealth: Payer: Self-pay | Admitting: *Deleted

## 2015-08-08 NOTE — Telephone Encounter (Signed)
Left voicemail for patient to call our office if there are questions or concerns re; procedure on yesterday.  

## 2015-09-05 ENCOUNTER — Encounter: Payer: BLUE CROSS/BLUE SHIELD | Admitting: Pain Medicine

## 2015-09-12 ENCOUNTER — Encounter: Payer: Self-pay | Admitting: Pain Medicine

## 2015-09-12 ENCOUNTER — Ambulatory Visit: Payer: BLUE CROSS/BLUE SHIELD | Attending: Pain Medicine | Admitting: Pain Medicine

## 2015-09-12 ENCOUNTER — Other Ambulatory Visit: Payer: Self-pay | Admitting: Pain Medicine

## 2015-09-12 VITALS — BP 105/62 | Temp 97.6°F | Resp 16 | Ht 63.0 in | Wt 170.0 lb

## 2015-09-12 DIAGNOSIS — M7060 Trochanteric bursitis, unspecified hip: Secondary | ICD-10-CM

## 2015-09-12 DIAGNOSIS — M47814 Spondylosis without myelopathy or radiculopathy, thoracic region: Secondary | ICD-10-CM

## 2015-09-12 DIAGNOSIS — M5136 Other intervertebral disc degeneration, lumbar region: Secondary | ICD-10-CM

## 2015-09-12 DIAGNOSIS — M47818 Spondylosis without myelopathy or radiculopathy, sacral and sacrococcygeal region: Secondary | ICD-10-CM

## 2015-09-12 DIAGNOSIS — M5416 Radiculopathy, lumbar region: Secondary | ICD-10-CM

## 2015-09-12 DIAGNOSIS — M461 Sacroiliitis, not elsewhere classified: Secondary | ICD-10-CM

## 2015-09-12 DIAGNOSIS — M5481 Occipital neuralgia: Secondary | ICD-10-CM

## 2015-09-12 DIAGNOSIS — M47894 Other spondylosis, thoracic region: Secondary | ICD-10-CM

## 2015-09-12 DIAGNOSIS — M160 Bilateral primary osteoarthritis of hip: Secondary | ICD-10-CM

## 2015-09-12 DIAGNOSIS — G588 Other specified mononeuropathies: Secondary | ICD-10-CM

## 2015-09-12 DIAGNOSIS — M47816 Spondylosis without myelopathy or radiculopathy, lumbar region: Secondary | ICD-10-CM

## 2015-09-12 DIAGNOSIS — M5030 Other cervical disc degeneration, unspecified cervical region: Secondary | ICD-10-CM

## 2015-09-12 MED ORDER — HYDROCODONE-ACETAMINOPHEN 5-325 MG PO TABS: ORAL_TABLET | ORAL | 0 refills | Status: DC

## 2015-09-12 MED ORDER — CYCLOBENZAPRINE HCL 10 MG PO TABS: ORAL_TABLET | ORAL | 0 refills | Status: DC

## 2015-09-12 NOTE — Progress Notes (Signed)
Safety precautions to be maintained throughout the outpatient stay will include: orient to surroundings, keep bed in low position, maintain call bell within reach at all times, provide assistance with transfer out of bed and ambulation.  

## 2015-09-12 NOTE — Progress Notes (Signed)
    The patient is a 50 year old female who returns to pain management for further evaluation and treatment of pain occurring the region of the neck associated with headaches as well as pain involving the mid lower back and lower extremity region. The patient states that she does have pain involving the neck which seems to cause headaches as well. We discussed patient's condition on today's visit and decision has been made to proceed with cervical MRI. We will also discussed performing bilateral occipital nerve blocks and other procedures. At the present time patient prefers to await results of the cervical MRI evidence time we will consider further evaluation and treatment of patient's condition. We will continue present medications including hydrocodone acetaminophen and Flexeril at this time and will consider neurological as well as neurosurgical evaluations and other treatment as discussed. The patient will continue under the care of of Dr.Reddy psych follow-up evaluations and under the care of of her primary care physician Dr. Lerry Paterson. All agreed to suggested treatment plan    Physical examination  There was moderate tenderness of the splenius capitis and occipitalis regions palpation of these regions reproduce moderate to severe pain. There were no masses of the head and neck noted. There were no bounding pulsations of the temporal region noted. Palpation of the splenius capitis and occipitalis regions reproduce severe pain and reproduce patient's symptoms. Outpatient over the region of the sinuses and temporomandibular joint region were with minimal increase of pain. There was tenderness of the acromial clavicular and glenohumeral joint regions of moderate degree and patient had mild difficulty performing drop test. The patient appeared to be with slightly decreased grip strength with Tinel and Phalen's maneuver reproducing minimal discomfort. There was no crepitus of the thoracic region noted.  Palpation over the lumbar region was attends to palpation of moderate degree with lateral bending rotation extension and palpation of the lumbar facets reproducing moderate discomfort. Straight leg raising was tolerates approximately 30 without increased pain with dorsiflexion noted. Palpation over the PSIS and PII S regions reproduce moderate discomfort There was moderate to moderately severe tenderness of the greater trochanteric region iliotibial band region. No definite sensory deficit or dermatomal distribution detected. There was negative clonus negative Homans. DTRs appeared to be trace at the knees. Abdomen nontender with no costovertebral tenderness noted.     Assessment   Bilateral occipital neuralgia  Degenerative disc disease of cervical spine  Degenerative changes lumbar spine  L4-L5 disc protrusion, with narrowing of the left neural foramen at this level, L4-5 annular disc bulging and left paracentral disc protrusion.   Lumbar facet syndrome   Lumbar radiculopathy     PLAN   Continue present medication Flexeril and hydrocodone acetaminophen  F/U PCP Dr. Lisette Grinder III for evaliation of  BP and general medical  condition  F/U surgical evaluation as discussed  F/U neurological evaluation. May consider PN[facility] EMG studies and other studies pending follow-up evaluations  F/U Dr.Reddy as discussed  Please ask the nurses and secretary the date of your cervical MRI   May consider radiofrequency rhizolysis or intraspinal procedures pending response to present treatment and F/U evaluation   Patient to call Pain Management Center should patient have concerns prior to scheduled return appointment   sacroiliac joint dysfunction  Greater trochanteric bursitis  Degenerative joint disease of hip

## 2015-09-12 NOTE — Patient Instructions (Addendum)
PLAN   Continue present medication Flexeril and hydrocodone acetaminophen  F/U PCP Dr. Lisette Grinder III for evaliation of  BP and general medical  condition  F/U surgical evaluation as discussed  F/U neurological evaluation. May consider PN[facility] EMG studies and other studies pending follow-up evaluations  F/U Dr.Reddy as discussed  Please ask the nurses and secretary the date of your cervical MRI   May consider radiofrequency rhizolysis or intraspinal procedures pending response to present treatment and F/U evaluation   Patient to call Pain Management Center should patient have concerns prior to scheduled return appointment

## 2015-10-03 ENCOUNTER — Other Ambulatory Visit: Payer: Self-pay | Admitting: Internal Medicine

## 2015-10-03 DIAGNOSIS — Z1231 Encounter for screening mammogram for malignant neoplasm of breast: Secondary | ICD-10-CM

## 2015-10-05 ENCOUNTER — Other Ambulatory Visit: Payer: Self-pay | Admitting: Pain Medicine

## 2015-10-05 ENCOUNTER — Ambulatory Visit
Admission: RE | Admit: 2015-10-05 | Discharge: 2015-10-05 | Disposition: A | Discharge status: Home / Self Care | Payer: BLUE CROSS/BLUE SHIELD | Source: Ambulatory Visit | Attending: Pain Medicine | Admitting: Pain Medicine

## 2015-10-05 DIAGNOSIS — M5030 Other cervical disc degeneration, unspecified cervical region: Secondary | ICD-10-CM | POA: Insufficient documentation

## 2015-10-05 DIAGNOSIS — M5010 Cervical disc disorder with radiculopathy, unspecified cervical region: Secondary | ICD-10-CM

## 2015-10-05 DIAGNOSIS — M50223 Other cervical disc displacement at C6-C7 level: Secondary | ICD-10-CM | POA: Diagnosis not present

## 2015-10-05 DIAGNOSIS — M5481 Occipital neuralgia: Secondary | ICD-10-CM | POA: Diagnosis present

## 2015-10-05 DIAGNOSIS — G588 Other specified mononeuropathies: Secondary | ICD-10-CM

## 2015-10-05 DIAGNOSIS — G548 Other nerve root and plexus disorders: Secondary | ICD-10-CM | POA: Diagnosis present

## 2015-10-05 DIAGNOSIS — M4802 Spinal stenosis, cervical region: Secondary | ICD-10-CM | POA: Insufficient documentation

## 2015-10-05 NOTE — Progress Notes (Signed)
Attempted to call patient, message left. 

## 2015-10-09 ENCOUNTER — Other Ambulatory Visit: Payer: Self-pay | Admitting: Pain Medicine

## 2015-10-12 ENCOUNTER — Other Ambulatory Visit: Payer: Self-pay | Admitting: Pain Medicine

## 2015-10-12 ENCOUNTER — Ambulatory Visit
Admission: RE | Admit: 2015-10-12 | Discharge: 2015-10-12 | Disposition: A | Discharge status: Home / Self Care | Payer: BLUE CROSS/BLUE SHIELD | Source: Ambulatory Visit | Attending: Pain Medicine | Admitting: Pain Medicine

## 2015-10-12 ENCOUNTER — Ambulatory Visit: Payer: BLUE CROSS/BLUE SHIELD | Admitting: Pain Medicine

## 2015-10-12 ENCOUNTER — Encounter: Payer: Self-pay | Admitting: Pain Medicine

## 2015-10-12 VITALS — BP 135/59 | HR 85 | Temp 97.8°F | Resp 16 | Ht 63.0 in | Wt 170.0 lb

## 2015-10-12 DIAGNOSIS — M47814 Spondylosis without myelopathy or radiculopathy, thoracic region: Secondary | ICD-10-CM

## 2015-10-12 DIAGNOSIS — M461 Sacroiliitis, not elsewhere classified: Secondary | ICD-10-CM

## 2015-10-12 DIAGNOSIS — M5416 Radiculopathy, lumbar region: Secondary | ICD-10-CM

## 2015-10-12 DIAGNOSIS — M5030 Other cervical disc degeneration, unspecified cervical region: Secondary | ICD-10-CM

## 2015-10-12 DIAGNOSIS — M47816 Spondylosis without myelopathy or radiculopathy, lumbar region: Secondary | ICD-10-CM

## 2015-10-12 DIAGNOSIS — M51369 Other intervertebral disc degeneration, lumbar region without mention of lumbar back pain or lower extremity pain: Secondary | ICD-10-CM

## 2015-10-12 DIAGNOSIS — M7060 Trochanteric bursitis, unspecified hip: Secondary | ICD-10-CM

## 2015-10-12 DIAGNOSIS — M5136 Other intervertebral disc degeneration, lumbar region: Secondary | ICD-10-CM

## 2015-10-12 DIAGNOSIS — R05 Cough: Secondary | ICD-10-CM | POA: Insufficient documentation

## 2015-10-12 DIAGNOSIS — M4802 Spinal stenosis, cervical region: Secondary | ICD-10-CM

## 2015-10-12 DIAGNOSIS — M47818 Spondylosis without myelopathy or radiculopathy, sacral and sacrococcygeal region: Secondary | ICD-10-CM

## 2015-10-12 DIAGNOSIS — G588 Other specified mononeuropathies: Secondary | ICD-10-CM

## 2015-10-12 DIAGNOSIS — M160 Bilateral primary osteoarthritis of hip: Secondary | ICD-10-CM

## 2015-10-12 DIAGNOSIS — R058 Other specified cough: Secondary | ICD-10-CM

## 2015-10-12 DIAGNOSIS — M47894 Other spondylosis, thoracic region: Secondary | ICD-10-CM

## 2015-10-12 DIAGNOSIS — M5481 Occipital neuralgia: Secondary | ICD-10-CM

## 2015-10-12 DIAGNOSIS — M5010 Cervical disc disorder with radiculopathy, unspecified cervical region: Secondary | ICD-10-CM

## 2015-10-12 MED ORDER — HYDROCODONE-ACETAMINOPHEN 5-325 MG PO TABS: ORAL_TABLET | ORAL | 0 refills | Status: DC

## 2015-10-12 MED ORDER — CYCLOBENZAPRINE HCL 10 MG PO TABS: ORAL_TABLET | ORAL | 0 refills | Status: AC

## 2015-10-12 MED ORDER — CYCLOBENZAPRINE HCL 10 MG PO TABS: ORAL_TABLET | ORAL | 0 refills | Status: DC

## 2015-10-12 NOTE — Patient Instructions (Addendum)
PLAN   Continue present medication Flexeril and hydrocodone acetaminophen  F/U PCP Dr. Lisette Grinder III for evaluation of  BP pulmonary condition with concern regarding pneumonia, and general medical  Condition . Also discuss pain of the cervical region and numbness of the upper extremities. We will proceed with neurosurgical evaluation in this regard. We'll obtain chest x-ray to evaluate chest for signs of pneumonia or other abnormalities which may be contributing to pain of the thoracic region and may be etiology of productive sputum  F/U surgical evaluation as discussed. Please ask the nurses and secretary the date of your neurosurgical evaluation of pain of the neck and upper extremities  F/U neurological evaluation. May consider PN[facility] EMG studies and other studies pending follow-up evaluations  F/U Dr.Reddy as discussed   May consider radiofrequency rhizolysis or intraspinal procedures pending response to present treatment and F/U evaluation   Patient to call Pain Management Center should patient have concerns prior to scheduled return appointment

## 2015-10-12 NOTE — Progress Notes (Signed)
The patient is a 50 year old female who returns to pain management for further evaluation and treatment of pain involving the neck entire back upper and lower extremity region with history of headaches as well. The patient states that his most bothersome pain involves the mid back region at this time. The patient states that she thinks that she may have pneumonia. The patient does have a productive call with what been described as. Limit sputum as well. The patient denies any trauma change in events of daily living the call significant change in symptomatology. We discussed patient's condition and informed patient that we would proceed with chest x-ray to evaluate patient for abnormalities of the chest and that we would have patient follow up with Dr. Lisette Grinder  III for further assessment of her condition as well. We discussed patient being with pain which could be due to inflammation of the pleura, costochondritis, intercostal neuralgia, severe muscle spasms, irritation of the diaphragm and other etiologies. Decision was made to proceed with chest x-ray and have patient undergo follow-up evaluation with her primary care physician at this time. We informed patient that we remain available to consider patient for additional modifications of treatment regimen pending further assessment of patient's condition. All agreed to suggested treatment plan. The patient will continue Flexeril and hydrocodone acetaminophen at this time.    Physical examination  There was tenderness to palpation of paraspinal muscular region cervical region cervical facet region palpation which reproduces pain of mild degree with mild tenderness of the splenius capitis and talus region. Palpation of the acromial clavicular and glenohumeral joint regions reproduces minimal discomfort and patient was able to perform drop test without significant difficulty. The patient appeared to be with slightly decreased grip strength with increase  of pain with Tinel and Phalen's maneuver a moderate degree. Palpation of the thoracic region was attends to palpation over the thoracic paraspinal musculature region with moderate to moderately severe tenderness to palpation of the mid and lower thoracic paraspinal musculature region. There was tenderness to palpation over the lumbar paraspinal musculatures and lumbar facet region a moderate degree with moderate tenderness with lateral bending rotation extension and palpation of the lumbar facets. Palpation of the PSIS and PII S region reproduces moderate discomfort with moderate to moderately severe tenderness of the greater trochanteric region iliotibial band region. Straight leg raise was tolerates approximately 30 without increased pain with dorsiflexion noted. The knees were tenderness to palpation in crepitus of the knees with negative anterior and posterior drawer signs.There was negative clonus negative Homans. Abdomen nontender with no costovertebral tenderness noted.     Assessment  Intercostal neuralgia  Costochondritis  Bilateral occipital neuralgia  Degenerative disc disease of cervical spine  Degenerative changes lumbar spine  L4-L5 disc protrusion, with narrowing of the left neural foramen at this level, L4-5 annular disc bulging and left paracentral disc protrusion.   Lumbar facet syndrome   Lumbar radiculopathy  Sacroiliac joint dysfunction  Greater trochanteric bursitis      PLAN   Continue present medication Flexeril and hydrocodone acetaminophen. May benefit from the addition of antibiotic as well as steroid therapy as well as nonsteroidal anti-inflammatory medication pending further assessment of patient's pain of the thoracic region  F/U PCP Dr. Lisette Grinder III for evaluation of  BP pulmonary condition with concern regarding pneumonia, and general medical  Condition . Also discuss pain of the cervical region and numbness of the upper extremities. We will  proceed with neurosurgical evaluation in this regard.  We willobtain chest x-ray to evaluate chest for signs of pneumonia or other abnormalities which may be contributing to pain of the thoracic region and may be etiology of productive sputum  F/U surgical evaluation as discussed. Please ask the nurses and secretary the date of your neurosurgical evaluation of pain of the neck and upper extremities  F/U neurological evaluation. May consider PN[facility] EMG studies and other studies pending follow-up evaluations  F/U Dr.Reddy as discussed   May consider radiofrequency rhizolysis or intraspinal procedures pending response to present treatment and F/U evaluation   Patient to call Pain Management Center should patient have concerns prior to scheduled return appointment

## 2015-10-12 NOTE — Progress Notes (Signed)
Patient here for medication management Safety precautions to be maintained throughout the outpatient stay will include: orient to surroundings, keep bed in low position, maintain call bell within reach at all times, provide assistance with transfer out of bed and ambulation.  

## 2015-10-19 ENCOUNTER — Ambulatory Visit: Payer: BLUE CROSS/BLUE SHIELD

## 2015-10-19 ENCOUNTER — Telehealth: Payer: Self-pay | Admitting: *Deleted

## 2015-10-19 NOTE — Telephone Encounter (Signed)
Phone message left for patient re; CXR  Results and Dr Ethel Rana recommendation.

## 2015-10-19 NOTE — Progress Notes (Signed)
Voice mail left for patient.

## 2015-10-31 ENCOUNTER — Ambulatory Visit: Payer: BLUE CROSS/BLUE SHIELD | Attending: Internal Medicine

## 2015-11-16 ENCOUNTER — Other Ambulatory Visit: Payer: Self-pay | Admitting: Pain Medicine

## 2015-11-22 ENCOUNTER — Telehealth: Payer: Self-pay | Admitting: *Deleted

## 2015-11-22 NOTE — Telephone Encounter (Signed)
Patient is asking for phone refill auth on Flexeril. Idamae Lusher at pharmacy that he would  have to call Dr. Primus Bravo at his office in [place].

## 2015-11-30 ENCOUNTER — Ambulatory Visit
Admission: RE | Admit: 2015-11-30 | Discharge: 2015-11-30 | Disposition: A | Discharge status: Home / Self Care | Payer: BLUE CROSS/BLUE SHIELD | Source: Ambulatory Visit | Attending: Internal Medicine | Admitting: Internal Medicine

## 2015-11-30 DIAGNOSIS — Z1231 Encounter for screening mammogram for malignant neoplasm of breast: Secondary | ICD-10-CM | POA: Diagnosis not present

## 2015-12-15 ENCOUNTER — Ambulatory Visit
Admission: RE | Admit: 2015-12-15 | Payer: BLUE CROSS/BLUE SHIELD | Source: Ambulatory Visit | Admitting: Unknown Physician Specialty

## 2015-12-15 ENCOUNTER — Encounter: Admission: RE | Payer: Self-pay | Source: Ambulatory Visit

## 2015-12-15 SURGERY — COLONOSCOPY WITH PROPOFOL
Anesthesia: General

## 2016-03-08 ENCOUNTER — Encounter: Payer: Self-pay | Admitting: *Deleted

## 2016-03-11 ENCOUNTER — Encounter
Admission: RE | Disposition: A | Discharge status: Home / Self Care | Payer: Self-pay | Source: Ambulatory Visit | Attending: Unknown Physician Specialty

## 2016-03-11 ENCOUNTER — Encounter: Payer: Self-pay | Admitting: Anesthesiology

## 2016-03-11 ENCOUNTER — Ambulatory Visit: Payer: BLUE CROSS/BLUE SHIELD | Admitting: Anesthesiology

## 2016-03-11 ENCOUNTER — Ambulatory Visit
Admission: RE | Admit: 2016-03-11 | Discharge: 2016-03-11 | Disposition: A | Discharge status: Home / Self Care | Payer: BLUE CROSS/BLUE SHIELD | Source: Ambulatory Visit | Attending: Unknown Physician Specialty | Admitting: Unknown Physician Specialty

## 2016-03-11 DIAGNOSIS — E119 Type 2 diabetes mellitus without complications: Secondary | ICD-10-CM | POA: Diagnosis not present

## 2016-03-11 DIAGNOSIS — F17210 Nicotine dependence, cigarettes, uncomplicated: Secondary | ICD-10-CM | POA: Diagnosis not present

## 2016-03-11 DIAGNOSIS — K640 First degree hemorrhoids: Secondary | ICD-10-CM | POA: Diagnosis not present

## 2016-03-11 DIAGNOSIS — Z79899 Other long term (current) drug therapy: Secondary | ICD-10-CM | POA: Insufficient documentation

## 2016-03-11 DIAGNOSIS — K219 Gastro-esophageal reflux disease without esophagitis: Secondary | ICD-10-CM | POA: Insufficient documentation

## 2016-03-11 DIAGNOSIS — R1013 Epigastric pain: Secondary | ICD-10-CM | POA: Insufficient documentation

## 2016-03-11 DIAGNOSIS — G43909 Migraine, unspecified, not intractable, without status migrainosus: Secondary | ICD-10-CM | POA: Insufficient documentation

## 2016-03-11 DIAGNOSIS — Z86010 Personal history of colonic polyps: Secondary | ICD-10-CM | POA: Diagnosis not present

## 2016-03-11 DIAGNOSIS — F329 Major depressive disorder, single episode, unspecified: Secondary | ICD-10-CM | POA: Insufficient documentation

## 2016-03-11 DIAGNOSIS — K2950 Unspecified chronic gastritis without bleeding: Secondary | ICD-10-CM | POA: Diagnosis not present

## 2016-03-11 DIAGNOSIS — Z1211 Encounter for screening for malignant neoplasm of colon: Secondary | ICD-10-CM | POA: Insufficient documentation

## 2016-03-11 DIAGNOSIS — Z7982 Long term (current) use of aspirin: Secondary | ICD-10-CM | POA: Diagnosis not present

## 2016-03-11 DIAGNOSIS — R112 Nausea with vomiting, unspecified: Secondary | ICD-10-CM | POA: Insufficient documentation

## 2016-03-11 DIAGNOSIS — K621 Rectal polyp: Secondary | ICD-10-CM | POA: Diagnosis not present

## 2016-03-11 HISTORY — DX: Irritable bowel syndrome, unspecified: K58.9

## 2016-03-11 HISTORY — DX: Cardiac arrhythmia, unspecified: I49.9

## 2016-03-11 HISTORY — PX: COLONOSCOPY WITH PROPOFOL: SHX5780

## 2016-03-11 HISTORY — DX: Type 2 diabetes mellitus without complications: E11.9

## 2016-03-11 LAB — GLUCOSE, CAPILLARY: GLUCOSE-CAPILLARY: 154 mg/dL — AB (ref 65–99)

## 2016-03-11 LAB — POCT PREGNANCY, URINE: Preg Test, Ur: NEGATIVE

## 2016-03-11 SURGERY — COLONOSCOPY WITH PROPOFOL
Anesthesia: General

## 2016-03-11 MED ORDER — PROPOFOL 500 MG/50ML IV EMUL
INTRAVENOUS | Status: AC
  Filled 2016-03-11: qty 50

## 2016-03-11 MED ORDER — FENTANYL CITRATE (PF) 100 MCG/2ML IJ SOLN
INTRAMUSCULAR | Status: AC
  Filled 2016-03-11: qty 2

## 2016-03-11 MED ORDER — MIDAZOLAM HCL 2 MG/2ML IJ SOLN
INTRAMUSCULAR | Status: AC
  Filled 2016-03-11: qty 2

## 2016-03-11 MED ORDER — MIDAZOLAM HCL 5 MG/5ML IJ SOLN
INTRAMUSCULAR | Status: DC | PRN
Start: 2016-03-11 — End: 2016-03-11
  Administered 2016-03-11: 2 mg via INTRAVENOUS

## 2016-03-11 MED ORDER — PROPOFOL 10 MG/ML IV BOLUS
INTRAVENOUS | Status: DC | PRN
  Administered 2016-03-11 (×2): 20 mg via INTRAVENOUS
  Administered 2016-03-11: 50 mg via INTRAVENOUS

## 2016-03-11 MED ORDER — SODIUM CHLORIDE 0.9 % IV SOLN: INTRAVENOUS | Status: DC

## 2016-03-11 MED ORDER — PROPOFOL 500 MG/50ML IV EMUL
INTRAVENOUS | Status: DC | PRN
  Administered 2016-03-11: 75 ug/kg/min via INTRAVENOUS

## 2016-03-11 MED ORDER — FENTANYL CITRATE (PF) 100 MCG/2ML IJ SOLN
INTRAMUSCULAR | Status: DC | PRN
  Administered 2016-03-11 (×2): 25 ug via INTRAVENOUS
  Administered 2016-03-11: 50 ug via INTRAVENOUS

## 2016-03-11 MED ORDER — LIDOCAINE HCL (PF) 2 % IJ SOLN
INTRAMUSCULAR | Status: DC | PRN
  Administered 2016-03-11: 50 mg

## 2016-03-11 MED ORDER — SODIUM CHLORIDE 0.9 % IV SOLN
INTRAVENOUS | Status: DC
  Administered 2016-03-11: 14:00:00 via INTRAVENOUS
  Administered 2016-03-11: 1000 mL via INTRAVENOUS

## 2016-03-11 NOTE — Op Note (Addendum)
[facility] Gastroenterology Patient Name: Meagan Bridges Procedure Date: 03/11/2016 1:04 PM MRN: [id number] Account #: [id number] Date of Birth: 02/16/1965 Admit Type: Outpatient Age: 51 Room: [facility] ENDO ROOM 1 Gender: Female Note Status: Finalized Procedure:            Colonoscopy Indications:          High risk colon cancer surveillance: Personal history                        of colonic polyps Providers:            Manya Silvas, MD Referring MD:         Hewitt Blade. Sarina Ser, MD (Referring MD) Medicines:            Propofol per Anesthesia Complications:        No immediate complications. Procedure:            Pre-Anesthesia Assessment:                       - After reviewing the risks and benefits, the patient                        was deemed in satisfactory condition to undergo the                        procedure.                       After obtaining informed consent, the colonoscope was                        passed under direct vision. Throughout the procedure,                        the patient's blood pressure, pulse, and oxygen                        saturations were monitored continuously. The                        Colonoscope was introduced through the anus and                        advanced to the the cecum, identified by appendiceal                        orifice and ileocecal valve. The colonoscopy was                        performed without difficulty. The patient tolerated the                        procedure well. The quality of the bowel preparation                        was excellent. Findings:      A diminutive polyp was found in the recto-sigmoid colon. The polyp was       sessile. The polyp was removed with a jumbo cold forceps. Resection and       retrieval were complete.      Internal hemorrhoids were  found during endoscopy. The hemorrhoids were       small and Grade I (internal hemorrhoids that do not prolapse).      The exam  was otherwise without abnormality. Impression:           - One diminutive polyp at the recto-sigmoid colon,                        removed with a jumbo cold forceps. Resected and                        retrieved.                       - Internal hemorrhoids.                       - The examination was otherwise normal. Recommendation:       - Await pathology results. Manya Silvas, MD 03/11/2016 1:47:25 PM This report has been signed electronically. Number of Addenda: 0 Note Initiated On: 03/11/2016 1:04 PM Scope Withdrawal Time: 0 hours 9 minutes 29 seconds  Total Procedure Duration: 0 hours 23 minutes 2 seconds       [facility]

## 2016-03-11 NOTE — H&P (Signed)
Primary Care Physician:  Madelyn Brunner, MD Primary Gastroenterologist:  Dr. Vira Agar  Pre-Procedure History & Physical: HPI:  Meagan Bridges is a 51 y.o. female is here for an colonoscopy.   Past Medical History:  Diagnosis Date  . Back pain   . Depression   . Diabetes mellitus without complication ([place])   . Dysrhythmia    tachycardia  . GERD (gastroesophageal reflux disease)   . IBS (irritable bowel syndrome)   . Migraines     Past Surgical History:  Procedure Laterality Date  . CHOLECYSTECTOMY    . [place] SURGERY  2002  . UTERINE FIBROID SURGERY  2000    Prior to Admission medications   Medication Sig Start Date End Date Taking? Authorizing Provider  aspirin 81 MG tablet Take 81 mg by mouth daily. Reported on 04/24/2015   Yes Historical Provider, MD  ALPRAZolam Duanne Moron) 0.5 MG tablet Take 0.5 mg by mouth 3 (three) times daily as needed for anxiety.     Historical Provider, MD  amitriptyline (ELAVIL) 25 MG tablet Take 50 mg by mouth at bedtime. 09/14/14   Historical Provider, MD  Armodafinil 250 MG tablet Take 400 mg by mouth daily.     Historical Provider, MD  Biotin 5000 MCG TABS Take 1 tablet by mouth daily.    Historical Provider, MD  cyclobenzaprine (FLEXERIL) 10 MG tablet Limit 1 tablet by mouth 2 - 3 times per day if tolerated 10/12/15   Mohammed Kindle, MD  diltiazem (DILACOR XR) 180 MG 24 hr capsule Take 120 mg by mouth daily.     Historical Provider, MD  DULoxetine (CYMBALTA) 60 MG capsule Take 60 mg by mouth 2 (two) times daily.    Historical Provider, MD  HYDROcodone-acetaminophen (NORCO/VICODIN) 5-325 MG tablet Limit 1 tab by mouth 2 - 3  times per day if tolerated 10/12/15   Mohammed Kindle, MD  metFORMIN (GLUCOPHAGE) 500 MG tablet Take 500 mg by mouth 2 (two) times daily.     Historical Provider, MD  omeprazole (PRILOSEC) 20 MG capsule Take 20 mg by mouth daily.    Historical Provider, MD  perphenazine (TRILAFON) 4 MG tablet Take 4 mg by mouth at bedtime.     Historical Provider, MD  topiramate (TOPAMAX) 100 MG tablet Take 100 mg by mouth at bedtime. 2 tabs at night    Historical Provider, MD    Allergies as of 02/20/2016 - Review Complete 10/12/2015  Allergen Reaction Noted  . Latex  06/07/2014  . Niacin  06/07/2014  . Tape  06/07/2014    Family History  Problem Relation Age of Onset  . Diabetes Mother   . Kidney disease Mother   . Miscarriages / [place] Mother   . Diabetes Father   . Early death Father   . Hearing loss Father   . Heart disease Father   . Hyperlipidemia Father   . Hypertension Father   . Learning disabilities Father   . Breast cancer Neg Hx     Social History   Social History  . Marital status: Married    Spouse name: N/A  . Number of children: N/A  . Years of education: N/A   Occupational History  . Not on file.   Social History Main Topics  . Smoking status: Current Every Day Smoker    Packs/day: 2.00    Types: Cigarettes  . Smokeless tobacco: Never Used  . Alcohol use No  . Drug use: No  . Sexual activity: Yes  Birth control/ protection: Post-menopausal   Other Topics Concern  . Not on file   Social History Narrative  . No narrative on file    Review of Systems: See HPI, otherwise negative ROS  Physical Exam: BP 125/77   Pulse 99   Temp (!) 96.1 F (35.6 C) (Tympanic)   Resp 17   Ht 5\' 3"  (1.6 m)   Wt 74.8 kg (165 lb)   SpO2 99%   BMI 29.23 kg/m  General:   Alert,  pleasant and cooperative in NAD Head:  Normocephalic and atraumatic. Neck:  Supple; no masses or thyromegaly. Lungs:  Clear throughout to auscultation.    Heart:  Regular rate and rhythm. Abdomen:  Soft, nontender and nondistended. Normal bowel sounds, without guarding, and without rebound.   Neurologic:  Alert and  oriented x4;  grossly normal neurologically.  Impression/Plan: Meagan Bridges is here for an colonoscopy to be performed for [facility] colon polyps.  Risks, benefits, limitations, and alternatives  regarding  colonoscopy have been reviewed with the patient.  Questions have been answered.  All parties agreeable.   Gaylyn Cheers, MD  03/11/2016, 3:52 PM

## 2016-03-11 NOTE — Transfer of Care (Signed)
Immediate Anesthesia Transfer of Care Note  Patient: Meagan Bridges  Procedure(s) Performed: Procedure(s): COLONOSCOPY WITH PROPOFOL (N/A)  Patient Location: PACU  Anesthesia Type:General  Level of Consciousness: sedated  Airway & Oxygen Therapy: Patient Spontanous Breathing and Patient connected to nasal cannula oxygen  Post-op Assessment: Report given to RN and Post -op Vital signs reviewed and stable  Post vital signs: Reviewed and stable  Last Vitals:  Vitals:   03/11/16 1135  BP: 125/77  Pulse: 99  Resp: 17  Temp: (!) 35.9 C    Last Pain:  Vitals:   03/11/16 1135  TempSrc: Tympanic  PainSc: 7       Patients Stated Pain Goal: 7 ([id number])  Complications: No apparent anesthesia complications

## 2016-03-11 NOTE — Anesthesia Postprocedure Evaluation (Signed)
Anesthesia Post Note  Patient: [name]  Procedure(s) Performed: Procedure(s) (LRB): COLONOSCOPY WITH PROPOFOL (N/A)  Patient location during evaluation: Endoscopy Anesthesia Type: General Level of consciousness: awake and alert Pain management: pain level controlled Vital Signs Assessment: post-procedure vital signs reviewed and stable Respiratory status: spontaneous breathing, nonlabored ventilation, respiratory function stable and patient connected to nasal cannula oxygen Cardiovascular status: blood pressure returned to baseline and stable Postop Assessment: no signs of nausea or vomiting Anesthetic complications: no     Last Vitals:  Vitals:   03/11/16 1135 03/11/16 1350  BP: 125/77   Pulse: 99   Resp: 17   Temp: (!) 35.9 C (!) 35.6 C    Last Pain:  Vitals:   03/11/16 1350  TempSrc: Tympanic  PainSc:                  Wanda Cellucci S

## 2016-03-11 NOTE — Anesthesia Preprocedure Evaluation (Addendum)
Anesthesia Evaluation  Patient identified by MRN, date of birth, ID band Patient awake    Reviewed: Allergy & Precautions, NPO status , Patient's Chart, lab work & pertinent test results, reviewed documented beta blocker date and time   Airway Mallampati: II  TM Distance: >3 FB     Dental  (+) Chipped, Partial Upper, Lower Dentures   Pulmonary Current Smoker,           Cardiovascular + dysrhythmias      Neuro/Psych  Headaches, PSYCHIATRIC DISORDERS Depression  Neuromuscular disease    GI/Hepatic GERD  ,  Endo/Other  diabetes, Type 2  Renal/GU      Musculoskeletal  (+) Arthritis ,   Abdominal   Peds  (+) premature delivery Hematology   Anesthesia Other Findings Hx of tachycardia without syncope. Did not take cardizem this am.  Reproductive/Obstetrics                            Anesthesia Physical Anesthesia Plan  ASA: III  Anesthesia Plan: General   Post-op Pain Management:    Induction: Intravenous  Airway Management Planned: Nasal Cannula  Additional Equipment:   Intra-op Plan:   Post-operative Plan:   Informed Consent: I have reviewed the patients History and Physical, chart, labs and discussed the procedure including the risks, benefits and alternatives for the proposed anesthesia with the patient or authorized representative who has indicated his/her understanding and acceptance.     Plan Discussed with: CRNA  Anesthesia Plan Comments:         Anesthesia Quick Evaluation

## 2016-03-11 NOTE — Op Note (Addendum)
[facility] Gastroenterology Patient Name: Meagan Bridges Procedure Date: 03/11/2016 1:07 PM MRN: [id number] Account #: [id number] Date of Birth: Aug 01, 1965 Admit Type: Outpatient Age: 51 Room: [facility] ENDO ROOM 1 Gender: Female Note Status: Finalized Procedure:            Upper GI endoscopy Indications:          Epigastric abdominal pain, Heartburn, Nausea with                        vomiting Providers:            Manya Silvas, MD Referring MD:         Hewitt Blade. Sarina Ser, MD (Referring MD) Medicines:            Propofol per Anesthesia Complications:        No immediate complications. Procedure:            Pre-Anesthesia Assessment:                       - After reviewing the risks and benefits, the patient                        was deemed in satisfactory condition to undergo the                        procedure.                       After obtaining informed consent, the endoscope was                        passed under direct vision. Throughout the procedure,                        the patient's blood pressure, pulse, and oxygen                        saturations were monitored continuously. The Endoscope                        was introduced through the mouth, and advanced to the                        second part of duodenum. The upper GI endoscopy was                        accomplished without difficulty. The patient tolerated                        the procedure well. Findings:      The examined esophagus was normal. GEJ 38cm.      Diffuse mild inflammation characterized by adherent tiny flecks of       blood, erythema and granularity was found in the gastric antrum.       Biopsies were taken with a cold forceps for histology. Biopsies were       taken with a cold forceps for Helicobacter pylori testing. The body was       much less involved.      The examined duodenum was normal. Impression:           - Normal esophagus.                       -  Gastritis. Biopsied.                       - Normal examined duodenum. Recommendation:       - Await pathology results.                       - Perform a colonoscopy as previously scheduled. Manya Silvas, MD 03/11/2016 1:18:57 PM This report has been signed electronically. Number of Addenda: 0 Note Initiated On: 03/11/2016 1:07 PM      [facility]

## 2016-03-11 NOTE — Anesthesia Post-op Follow-up Note (Cosign Needed)
Anesthesia QCDR form completed.        

## 2016-03-12 ENCOUNTER — Encounter: Payer: Self-pay | Admitting: Unknown Physician Specialty

## 2016-03-12 ENCOUNTER — Other Ambulatory Visit: Payer: Self-pay | Admitting: Pain Medicine

## 2016-03-12 LAB — SURGICAL PATHOLOGY

## 2016-03-19 ENCOUNTER — Other Ambulatory Visit: Payer: Self-pay | Admitting: Pain Medicine

## 2018-01-15 ENCOUNTER — Other Ambulatory Visit: Payer: Self-pay | Admitting: Certified Nurse Midwife

## 2018-01-15 DIAGNOSIS — Z1231 Encounter for screening mammogram for malignant neoplasm of breast: Secondary | ICD-10-CM

## 2018-02-06 ENCOUNTER — Ambulatory Visit
Admission: RE | Admit: 2018-02-06 | Discharge: 2018-02-06 | Disposition: A | Discharge status: Home / Self Care | Payer: BLUE CROSS/BLUE SHIELD | Source: Ambulatory Visit | Attending: Certified Nurse Midwife | Admitting: Certified Nurse Midwife

## 2018-02-06 DIAGNOSIS — Z1231 Encounter for screening mammogram for malignant neoplasm of breast: Secondary | ICD-10-CM | POA: Diagnosis present

## 2018-04-13 ENCOUNTER — Encounter (HOSPITAL_BASED_OUTPATIENT_CLINIC_OR_DEPARTMENT_OTHER): Payer: Self-pay | Admitting: *Deleted

## 2018-04-14 ENCOUNTER — Encounter (HOSPITAL_BASED_OUTPATIENT_CLINIC_OR_DEPARTMENT_OTHER): Payer: Self-pay | Admitting: *Deleted

## 2018-04-14 ENCOUNTER — Other Ambulatory Visit: Payer: Self-pay

## 2018-04-14 NOTE — H&P (Signed)
HPI:   Meagan Bridges is a 53 y.o. female who presents as a consult Patient.   Referring Provider: Mickeal Needy,*  Chief complaint: Severe hoarseness.  HPI: She has been severely hoarseness since July when she had pneumonia. It has not gotten any better or worse. She is having no difficulty breathing. She has some trouble swallowing sometimes. She has a long history of reflux. She is on Nexium. She smokes 2-3 packs/day. She drinks about 4 cups of coffee daily and about 3 or 4 Pepsi's daily. Has had pneumonia in the past.  PMH/Meds/All/SocHx/FamHx/ROS:   Past Medical History:  Diagnosis Date  . Acid reflux  . Back pain  . Diabetes mellitus ([place])   Past Surgical History:  Procedure Laterality Date  . BACK SURGERY  . CHOLECYSTECTOMY  . UTERINE FIBROID SURGERY   No family history of bleeding disorders, wound healing problems or difficulty with anesthesia.   Social History   Socioeconomic History  . Marital status: Single  Spouse name: Not on file  . Number of children: Not on file  . Years of education: Not on file  . Highest education level: Not on file  Occupational History  . Not on file  Social Needs  . Financial resource strain: Not on file  . Food insecurity:  Worry: Not on file  Inability: Not on file  . Transportation needs:  Medical: Not on file  Non-medical: Not on file  Tobacco Use  . Smoking status: Current Every Day Smoker  Packs/day: 0.00  . Smokeless tobacco: Never Used  Substance and Sexual Activity  . Alcohol use: Not on file  . Drug use: Not on file  . Sexual activity: Not on file  Lifestyle  . Physical activity:  Days per week: Not on file  Minutes per session: Not on file  . Stress: Not on file  Relationships  . Social connections:  Talks on phone: Not on file  Gets together: Not on file  Attends religious service: Not on file  Active member of club or organization: Not on file  Attends meetings of clubs or organizations: Not  on file  Relationship status: Not on file  Other Topics Concern  . Not on file  Social History Narrative  . Not on file   Current Outpatient Medications:  . albuterol 90 mcg/actuation inhaler, Inhale into the lungs., Disp: , Rfl:  . amitriptyline (ELAVIL) 25 MG tablet, Take 50 mg by mouth., Disp: , Rfl:  . BIOTIN-KERATIN ORAL, Take 5,000 mcg by mouth., Disp: , Rfl:  . busPIRone (BUSPAR) 15 MG tablet, 3 (three) times a day, Disp: , Rfl:  . calcium carbonate/vitamin D3 (VITAMIN D-3 ORAL), Take by mouth., Disp: , Rfl:  . cyanocobalamin, vitamin B-12, (VITAMIN B-12 ORAL), Take by mouth., Disp: , Rfl:  . diltiazem (CARTIA XT) 120 MG 24 hr capsule, TAKE 1 CAPSULE DAILY, Disp: , Rfl:  . DULoxetine (CYMBALTA) 60 MG capsule, Take 60 mg by mouth., Disp: , Rfl:  . esomeprazole (NEXIUM, NEXIUM 24HR) 20 MG capsule, Take 20 mg by mouth., Disp: , Rfl:  . fexofenadine (ALLEGRA) 180 MG tablet, TAKE 1 TABLET (180 MG TOTAL) BY MOUTH ONCE DAILY, Disp: , Rfl: 11 . gabapentin (NEURONTIN) 300 MG capsule, Take by mouth., Disp: , Rfl:  . glucosamine sulfate (GLUCOSAMINE ORAL), Take by mouth., Disp: , Rfl:  . HYDROcodone-acetaminophen (NORCO 10-325) 10-325 mg per tablet, 3 (three) times daily as needed., Disp: , Rfl:  . magnesium citrate solution, Take 296 mLs by mouth once.,  Disp: , Rfl:  . metFORMIN (GLUCOPHAGE) 1000 MG tablet, Take 1,000 mg by mouth., Disp: , Rfl:  . methylphenidate HCl (RITALIN) 20 MG tablet, TAKE 1 TABLET BY MOUTH 3 TIMES A DAY, Disp: , Rfl: 0 . omeprazole (PRILOSEC) 20 MG capsule, TAKE 1 CAPSULE BY MOUTH EVERY DAY, Disp: , Rfl:  . perphenazine 4 MG tablet, Take 4 mg by mouth., Disp: , Rfl:  . umeclidinium-vilanterol (ANORO ELLIPTA) 62.5-25 mcg/actuation inhaler, INHALE 1 PUFF INTO THE LUNGS ONCE DAILY, Disp: , Rfl:   A complete ROS was performed with pertinent positives/negatives noted in the HPI. The remainder of the ROS are negative.   Physical Exam:   Ht 1.6 m (5\' 3" )  Wt 77.1 kg  (170 lb)  BMI 30.11 kg/m   General: Very heavy tobacco odor, very hoarse voice, in no distress, breathing easily. Normal affect. In a pleasant mood. Head: Normocephalic, atraumatic. No masses, or scars. Eyes: Pupils are equal, and reactive to light. Vision is grossly intact. No spontaneous or gaze nystagmus. Ears: Ear canals are clear. Tympanic membranes are intact, with normal landmarks and the middle ears are clear and healthy. Hearing: Grossly normal. Nose: Nasal cavities are clear with healthy mucosa, no polyps or exudate. Airways are patent. Face: No masses or scars, facial nerve function is symmetric. Oral Cavity: Severe, diffuse dry oral mucosal surfaces. Tongue with normal mobility. Dentures in place. Oropharynx: Tonsils are symmetric. There are no mucosal masses identified. Tongue base appears normal and healthy. Larynx/Hypopharynx: Indirect exam inadequate Chest: Deferred Neck: No palpable masses, no cervical adenopathy, no thyroid nodules or enlargement. Neuro: Cranial nerves II-XII with normal function. Balance: Normal gate. Other findings: none.  Independent Review of Additional Tests or Records:  none  Procedures:  Procedure note: Flexible fiberoptic laryngoscopy  Details of the procedure were explained to the patient and all questions were answered.   Procedure:   After anesthetizing the nasal cavity with topical lidocaine and oxymetazoline, the flexible endoscope was introduced and passed through the nasal cavity into the nasopharynx. The scope was then advanced to the level of the oropharynx, then the hypopharynx and larynx.   Findings:   The posterior soft palate, uvula, tongue base and vallecula were visualized and appeared healthy without mucosal masses or lesions. The epiglottis, aryepiglottic folds, hypopharynx, supraglottis, glottis were visualized and appeared healthy without mucosal masses or lesions, except for superficial ulceration and leukoplakia  involving both true cords. It is very symmetric. There is no mass-effect. There is yellow exudate covering the interarytenoid mucosa. Vocal fold mobility was intact and symmetric.   Additional findings: None  The scope was withdrawn from the nose. She tolerated the procedure well.   Impression & Plans:  Severe chronic laryngitis. She is at high risk for malignancy but it does not appear to be neoplastic at this time. I would like to recheck in 2 months to make sure there is no change. We had a lengthy discussion about the effects of smoking on the larynx. It is extremely important that she try to find a way to quit smoking. We also had a lengthy discussion about reflux and the effects of caffeine and tobacco on that. All of these factors are contributing to the laryngitis. The triggering factor may have been coughing and traumatic injury during the pneumonia. Unfortunately I have no other medical treatment that will make things better right now.

## 2018-04-17 ENCOUNTER — Other Ambulatory Visit: Payer: Self-pay

## 2018-04-17 ENCOUNTER — Encounter (HOSPITAL_BASED_OUTPATIENT_CLINIC_OR_DEPARTMENT_OTHER)
Admission: RE | Admit: 2018-04-17 | Discharge: 2018-04-17 | Disposition: A | Discharge status: Home / Self Care | Payer: BLUE CROSS/BLUE SHIELD | Source: Ambulatory Visit | Attending: Otolaryngology | Admitting: Otolaryngology

## 2018-04-17 DIAGNOSIS — J383 Other diseases of vocal cords: Secondary | ICD-10-CM | POA: Diagnosis not present

## 2018-04-17 DIAGNOSIS — Z01818 Encounter for other preprocedural examination: Secondary | ICD-10-CM | POA: Diagnosis not present

## 2018-04-17 DIAGNOSIS — F329 Major depressive disorder, single episode, unspecified: Secondary | ICD-10-CM | POA: Diagnosis not present

## 2018-04-17 DIAGNOSIS — E669 Obesity, unspecified: Secondary | ICD-10-CM | POA: Diagnosis not present

## 2018-04-17 DIAGNOSIS — Z7984 Long term (current) use of oral hypoglycemic drugs: Secondary | ICD-10-CM | POA: Diagnosis not present

## 2018-04-17 DIAGNOSIS — R1310 Dysphagia, unspecified: Secondary | ICD-10-CM | POA: Diagnosis not present

## 2018-04-17 DIAGNOSIS — R490 Dysphonia: Secondary | ICD-10-CM | POA: Diagnosis present

## 2018-04-17 DIAGNOSIS — M5481 Occipital neuralgia: Secondary | ICD-10-CM | POA: Diagnosis not present

## 2018-04-17 DIAGNOSIS — K589 Irritable bowel syndrome without diarrhea: Secondary | ICD-10-CM | POA: Diagnosis not present

## 2018-04-17 DIAGNOSIS — E119 Type 2 diabetes mellitus without complications: Secondary | ICD-10-CM | POA: Diagnosis not present

## 2018-04-17 DIAGNOSIS — R234 Changes in skin texture: Secondary | ICD-10-CM | POA: Diagnosis not present

## 2018-04-17 DIAGNOSIS — Z79899 Other long term (current) drug therapy: Secondary | ICD-10-CM | POA: Diagnosis not present

## 2018-04-17 DIAGNOSIS — M1990 Unspecified osteoarthritis, unspecified site: Secondary | ICD-10-CM | POA: Diagnosis not present

## 2018-04-17 DIAGNOSIS — J370 Chronic laryngitis: Secondary | ICD-10-CM | POA: Diagnosis not present

## 2018-04-17 DIAGNOSIS — Z6830 Body mass index (BMI) 30.0-30.9, adult: Secondary | ICD-10-CM | POA: Diagnosis not present

## 2018-04-17 DIAGNOSIS — G8929 Other chronic pain: Secondary | ICD-10-CM | POA: Diagnosis not present

## 2018-04-17 DIAGNOSIS — F17210 Nicotine dependence, cigarettes, uncomplicated: Secondary | ICD-10-CM | POA: Diagnosis not present

## 2018-04-17 DIAGNOSIS — J449 Chronic obstructive pulmonary disease, unspecified: Secondary | ICD-10-CM | POA: Diagnosis not present

## 2018-04-17 DIAGNOSIS — Z7951 Long term (current) use of inhaled steroids: Secondary | ICD-10-CM | POA: Diagnosis not present

## 2018-04-17 DIAGNOSIS — F419 Anxiety disorder, unspecified: Secondary | ICD-10-CM | POA: Diagnosis not present

## 2018-04-17 DIAGNOSIS — K219 Gastro-esophageal reflux disease without esophagitis: Secondary | ICD-10-CM | POA: Diagnosis not present

## 2018-04-17 LAB — BASIC METABOLIC PANEL
ANION GAP: 10 (ref 5–15)
BUN: 9 mg/dL (ref 6–20)
CALCIUM: 9.3 mg/dL (ref 8.9–10.3)
CO2: 22 mmol/L (ref 22–32)
CREATININE: 1.08 mg/dL — AB (ref 0.44–1.00)
Chloride: 104 mmol/L (ref 98–111)
GFR, EST NON AFRICAN AMERICAN: 58 mL/min — AB (ref >60)
GLUCOSE: 264 mg/dL — AB (ref 70–99)
POTASSIUM: 4.7 mmol/L (ref 3.5–5.1)
SODIUM: 136 mmol/L (ref 135–145)

## 2018-04-17 NOTE — Progress Notes (Signed)
Glucose-264, EKG reviewed by Dr. Marcell Barlow, will proceed with surgery as scheduled.

## 2018-04-20 ENCOUNTER — Encounter (HOSPITAL_BASED_OUTPATIENT_CLINIC_OR_DEPARTMENT_OTHER): Payer: Self-pay

## 2018-04-20 ENCOUNTER — Ambulatory Visit (HOSPITAL_BASED_OUTPATIENT_CLINIC_OR_DEPARTMENT_OTHER): Payer: BLUE CROSS/BLUE SHIELD | Admitting: Anesthesiology

## 2018-04-20 ENCOUNTER — Other Ambulatory Visit: Payer: Self-pay

## 2018-04-20 ENCOUNTER — Encounter (HOSPITAL_BASED_OUTPATIENT_CLINIC_OR_DEPARTMENT_OTHER)
Admission: RE | Disposition: A | Discharge status: Home / Self Care | Payer: Self-pay | Source: Home / Self Care | Attending: Otolaryngology

## 2018-04-20 ENCOUNTER — Ambulatory Visit (HOSPITAL_BASED_OUTPATIENT_CLINIC_OR_DEPARTMENT_OTHER)
Admission: RE | Admit: 2018-04-20 | Discharge: 2018-04-20 | Disposition: A | Discharge status: Home / Self Care | Payer: BLUE CROSS/BLUE SHIELD | Attending: Otolaryngology | Admitting: Otolaryngology

## 2018-04-20 DIAGNOSIS — F419 Anxiety disorder, unspecified: Secondary | ICD-10-CM | POA: Insufficient documentation

## 2018-04-20 DIAGNOSIS — M5481 Occipital neuralgia: Secondary | ICD-10-CM | POA: Insufficient documentation

## 2018-04-20 DIAGNOSIS — J383 Other diseases of vocal cords: Secondary | ICD-10-CM | POA: Diagnosis not present

## 2018-04-20 DIAGNOSIS — E669 Obesity, unspecified: Secondary | ICD-10-CM | POA: Insufficient documentation

## 2018-04-20 DIAGNOSIS — K589 Irritable bowel syndrome without diarrhea: Secondary | ICD-10-CM | POA: Insufficient documentation

## 2018-04-20 DIAGNOSIS — J370 Chronic laryngitis: Secondary | ICD-10-CM | POA: Insufficient documentation

## 2018-04-20 DIAGNOSIS — G8929 Other chronic pain: Secondary | ICD-10-CM | POA: Insufficient documentation

## 2018-04-20 DIAGNOSIS — Z79899 Other long term (current) drug therapy: Secondary | ICD-10-CM | POA: Insufficient documentation

## 2018-04-20 DIAGNOSIS — K219 Gastro-esophageal reflux disease without esophagitis: Secondary | ICD-10-CM | POA: Insufficient documentation

## 2018-04-20 DIAGNOSIS — F329 Major depressive disorder, single episode, unspecified: Secondary | ICD-10-CM | POA: Insufficient documentation

## 2018-04-20 DIAGNOSIS — R1310 Dysphagia, unspecified: Secondary | ICD-10-CM | POA: Insufficient documentation

## 2018-04-20 DIAGNOSIS — E119 Type 2 diabetes mellitus without complications: Secondary | ICD-10-CM | POA: Insufficient documentation

## 2018-04-20 DIAGNOSIS — R490 Dysphonia: Secondary | ICD-10-CM | POA: Insufficient documentation

## 2018-04-20 DIAGNOSIS — Z7951 Long term (current) use of inhaled steroids: Secondary | ICD-10-CM | POA: Insufficient documentation

## 2018-04-20 DIAGNOSIS — F17210 Nicotine dependence, cigarettes, uncomplicated: Secondary | ICD-10-CM | POA: Insufficient documentation

## 2018-04-20 DIAGNOSIS — J449 Chronic obstructive pulmonary disease, unspecified: Secondary | ICD-10-CM | POA: Insufficient documentation

## 2018-04-20 DIAGNOSIS — M1990 Unspecified osteoarthritis, unspecified site: Secondary | ICD-10-CM | POA: Insufficient documentation

## 2018-04-20 DIAGNOSIS — Z7984 Long term (current) use of oral hypoglycemic drugs: Secondary | ICD-10-CM | POA: Insufficient documentation

## 2018-04-20 DIAGNOSIS — R234 Changes in skin texture: Secondary | ICD-10-CM | POA: Insufficient documentation

## 2018-04-20 DIAGNOSIS — Z6830 Body mass index (BMI) 30.0-30.9, adult: Secondary | ICD-10-CM | POA: Insufficient documentation

## 2018-04-20 HISTORY — DX: Anxiety disorder, unspecified: F41.9

## 2018-04-20 HISTORY — DX: Nicotine dependence, unspecified, uncomplicated: F17.200

## 2018-04-20 HISTORY — DX: Chronic pain syndrome: G89.4

## 2018-04-20 HISTORY — PX: MICROLARYNGOSCOPY: SHX5208

## 2018-04-20 HISTORY — DX: Chronic obstructive pulmonary disease, unspecified: J44.9

## 2018-04-20 LAB — GLUCOSE, CAPILLARY
GLUCOSE-CAPILLARY: 157 mg/dL — AB (ref 70–99)
Glucose-Capillary: 185 mg/dL — ABNORMAL HIGH (ref 70–99)

## 2018-04-20 SURGERY — MICROLARYNGOSCOPY
Anesthesia: General | Site: Throat

## 2018-04-20 MED ORDER — DEXAMETHASONE SODIUM PHOSPHATE 10 MG/ML IJ SOLN
INTRAMUSCULAR | Status: DC | PRN
  Administered 2018-04-20: 10 mg via INTRAVENOUS

## 2018-04-20 MED ORDER — LIDOCAINE-EPINEPHRINE 1 %-1:100000 IJ SOLN
INTRAMUSCULAR | Status: AC
  Filled 2018-04-20: qty 1

## 2018-04-20 MED ORDER — ROCURONIUM BROMIDE 10 MG/ML (PF) SYRINGE
PREFILLED_SYRINGE | INTRAVENOUS | Status: DC | PRN
  Administered 2018-04-20: 20 mg via INTRAVENOUS

## 2018-04-20 MED ORDER — ONDANSETRON HCL 4 MG/2ML IJ SOLN
INTRAMUSCULAR | Status: DC | PRN
  Administered 2018-04-20: 4 mg via INTRAVENOUS

## 2018-04-20 MED ORDER — FENTANYL CITRATE (PF) 100 MCG/2ML IJ SOLN: 25.0000 ug | INTRAMUSCULAR | Status: DC | PRN

## 2018-04-20 MED ORDER — ROCURONIUM BROMIDE 50 MG/5ML IV SOSY
PREFILLED_SYRINGE | INTRAVENOUS | Status: AC
  Filled 2018-04-20: qty 5

## 2018-04-20 MED ORDER — FENTANYL CITRATE (PF) 100 MCG/2ML IJ SOLN
INTRAMUSCULAR | Status: AC
  Filled 2018-04-20: qty 2

## 2018-04-20 MED ORDER — MIDAZOLAM HCL 2 MG/2ML IJ SOLN: 1.0000 mg | INTRAMUSCULAR | Status: DC | PRN

## 2018-04-20 MED ORDER — SUGAMMADEX SODIUM 200 MG/2ML IV SOLN
INTRAVENOUS | Status: AC
  Filled 2018-04-20: qty 2

## 2018-04-20 MED ORDER — SUGAMMADEX SODIUM 200 MG/2ML IV SOLN
INTRAVENOUS | Status: DC | PRN
  Administered 2018-04-20: 200 mg via INTRAVENOUS

## 2018-04-20 MED ORDER — SUCCINYLCHOLINE CHLORIDE 200 MG/10ML IV SOSY
PREFILLED_SYRINGE | INTRAVENOUS | Status: AC
  Filled 2018-04-20: qty 10

## 2018-04-20 MED ORDER — MIDAZOLAM HCL 2 MG/2ML IJ SOLN
INTRAMUSCULAR | Status: AC
  Filled 2018-04-20: qty 2

## 2018-04-20 MED ORDER — PROPOFOL 10 MG/ML IV BOLUS
INTRAVENOUS | Status: DC | PRN
  Administered 2018-04-20: 160 mg via INTRAVENOUS
  Administered 2018-04-20: 40 mg via INTRAVENOUS

## 2018-04-20 MED ORDER — OXYCODONE HCL 5 MG/5ML PO SOLN: 5.0000 mg | Freq: Once | ORAL | Status: DC | PRN

## 2018-04-20 MED ORDER — EPINEPHRINE PF 1 MG/ML IJ SOLN
INTRAMUSCULAR | Status: DC | PRN
  Administered 2018-04-20: 1 mg

## 2018-04-20 MED ORDER — LIDOCAINE-EPINEPHRINE 1 %-1:100000 IJ SOLN
INTRAMUSCULAR | Status: DC | PRN
  Administered 2018-04-20: .5 mL

## 2018-04-20 MED ORDER — LIDOCAINE HCL (CARDIAC) PF 100 MG/5ML IV SOSY
PREFILLED_SYRINGE | INTRAVENOUS | Status: DC | PRN
  Administered 2018-04-20: 20 mg via INTRAVENOUS
  Administered 2018-04-20: 80 mg via INTRAVENOUS

## 2018-04-20 MED ORDER — LACTATED RINGERS IV SOLN
INTRAVENOUS | Status: DC
  Administered 2018-04-20: 08:00:00 via INTRAVENOUS

## 2018-04-20 MED ORDER — FENTANYL CITRATE (PF) 100 MCG/2ML IJ SOLN
INTRAMUSCULAR | Status: DC | PRN
  Administered 2018-04-20 (×2): 50 ug via INTRAVENOUS

## 2018-04-20 MED ORDER — MIDAZOLAM HCL 2 MG/2ML IJ SOLN
INTRAMUSCULAR | Status: DC | PRN
  Administered 2018-04-20: 2 mg via INTRAVENOUS

## 2018-04-20 MED ORDER — DEXAMETHASONE SODIUM PHOSPHATE 10 MG/ML IJ SOLN
INTRAMUSCULAR | Status: AC
  Filled 2018-04-20: qty 1

## 2018-04-20 MED ORDER — ONDANSETRON HCL 4 MG/2ML IJ SOLN
INTRAMUSCULAR | Status: AC
  Filled 2018-04-20: qty 2

## 2018-04-20 MED ORDER — FENTANYL CITRATE (PF) 100 MCG/2ML IJ SOLN: 50.0000 ug | INTRAMUSCULAR | Status: DC | PRN

## 2018-04-20 MED ORDER — SUCCINYLCHOLINE CHLORIDE 200 MG/10ML IV SOSY
PREFILLED_SYRINGE | INTRAVENOUS | Status: DC | PRN
  Administered 2018-04-20: 120 mg via INTRAVENOUS

## 2018-04-20 MED ORDER — PROPOFOL 10 MG/ML IV BOLUS
INTRAVENOUS | Status: AC
  Filled 2018-04-20: qty 20

## 2018-04-20 MED ORDER — SCOPOLAMINE 1 MG/3DAYS TD PT72: 1.0000 | MEDICATED_PATCH | Freq: Once | TRANSDERMAL | Status: DC | PRN

## 2018-04-20 MED ORDER — PROMETHAZINE HCL 25 MG/ML IJ SOLN: 6.2500 mg | INTRAMUSCULAR | Status: DC | PRN

## 2018-04-20 MED ORDER — OXYCODONE HCL 5 MG PO TABS: 5.0000 mg | ORAL_TABLET | Freq: Once | ORAL | Status: DC | PRN

## 2018-04-20 MED ORDER — LIDOCAINE 2% (20 MG/ML) 5 ML SYRINGE
INTRAMUSCULAR | Status: AC
  Filled 2018-04-20: qty 5

## 2018-04-20 SURGICAL SUPPLY — 27 items
CANISTER SUCT 1200ML W/VALVE (MISCELLANEOUS) ×2 IMPLANT
COVER WAND RF STERILE (DRAPES) IMPLANT
GAUZE SPONGE 4X4 12PLY STRL LF (GAUZE/BANDAGES/DRESSINGS) ×4 IMPLANT
GLOVE ECLIPSE 7.5 STRL STRAW (GLOVE) IMPLANT
GLOVE SURG SS PI 6.5 STRL IVOR (GLOVE) ×2 IMPLANT
GLOVE SURG SYN 7.5  E (GLOVE) ×1
GLOVE SURG SYN 7.5 E (GLOVE) ×1 IMPLANT
GOWN STRL REUS W/ TWL LRG LVL3 (GOWN DISPOSABLE) ×1 IMPLANT
GOWN STRL REUS W/ TWL XL LVL3 (GOWN DISPOSABLE) IMPLANT
GOWN STRL REUS W/TWL LRG LVL3 (GOWN DISPOSABLE) ×1
GOWN STRL REUS W/TWL XL LVL3 (GOWN DISPOSABLE)
GUARD TEETH (MISCELLANEOUS) ×2 IMPLANT
MARKER SKIN DUAL TIP RULER LAB (MISCELLANEOUS) IMPLANT
NEEDLE HYPO 18GX1.5 BLUNT FILL (NEEDLE) ×2 IMPLANT
NEEDLE SPNL 22GX7 QUINCKE BK (NEEDLE) ×2 IMPLANT
NS IRRIG 1000ML POUR BTL (IV SOLUTION) ×2 IMPLANT
PACK BASIN DAY SURGERY FS (CUSTOM PROCEDURE TRAY) IMPLANT
PAD ALCOHOL SWAB (MISCELLANEOUS) ×2 IMPLANT
PATTIES SURGICAL .5 X3 (DISPOSABLE) ×2 IMPLANT
SHEET MEDIUM DRAPE 40X70 STRL (DRAPES) ×2 IMPLANT
SLEEVE SCD COMPRESS KNEE MED (MISCELLANEOUS) ×2 IMPLANT
SOLUTION BUTLER CLEAR DIP (MISCELLANEOUS) ×2 IMPLANT
SYR 5ML LL (SYRINGE) ×2 IMPLANT
SYR CONTROL 10ML LL (SYRINGE) ×2 IMPLANT
SYR TB 1ML LL NO SAFETY (SYRINGE) IMPLANT
TOWEL GREEN STERILE FF (TOWEL DISPOSABLE) ×2 IMPLANT
TUBE CONNECTING 20X1/4 (TUBING) ×2 IMPLANT

## 2018-04-20 NOTE — Transfer of Care (Signed)
Immediate Anesthesia Transfer of Care Note  Patient: Meagan Bridges  Procedure(s) Performed: MICROLARYNGOSCOPY with BIOPSY (N/A Throat)  Patient Location: PACU  Anesthesia Type:General  Level of Consciousness: awake, alert , oriented, drowsy and patient cooperative  Airway & Oxygen Therapy: Patient Spontanous Breathing and Patient connected to face mask oxygen  Post-op Assessment: Report given to RN and Post -op Vital signs reviewed and stable  Post vital signs: Reviewed and stable  Last Vitals:  Vitals Value Taken Time  BP 105/55 04/20/2018  9:22 AM  Temp    Pulse 85 04/20/2018  9:25 AM  Resp 18 04/20/2018  9:25 AM  SpO2 100 % 04/20/2018  9:25 AM  Vitals shown include unvalidated device data.  Last Pain:  Vitals:   04/20/18 0732  TempSrc: Oral  PainSc: 0-No pain         Complications: No apparent anesthesia complications

## 2018-04-20 NOTE — Anesthesia Preprocedure Evaluation (Addendum)
Anesthesia Evaluation  Patient identified by MRN, date of birth, ID band Patient awake    Reviewed: Allergy & Precautions, NPO status , Patient's Chart, lab work & pertinent test results  History of Anesthesia Complications Negative for: history of anesthetic complications  Airway Mallampati: III  TM Distance: >3 FB Neck ROM: Full    Dental  (+) Dental Advisory Given, Partial Lower   Pulmonary COPD,  COPD inhaler, Current Smoker,    breath sounds clear to auscultation       Cardiovascular + dysrhythmias  Rhythm:Regular Rate:Normal     Neuro/Psych  Headaches, PSYCHIATRIC DISORDERS Anxiety Depression  Neuromuscular disease (occipital neuralgia)    GI/Hepatic Neg liver ROS, GERD  Medicated, IBS    Endo/Other  diabetes, Type 2, Oral Hypoglycemic Agents Obesity   Renal/GU negative Renal ROS     Musculoskeletal  (+) Arthritis ,  Chronic pain d/o    Abdominal   Peds  Hematology negative hematology ROS (+)   Anesthesia Other Findings   Reproductive/Obstetrics                            Anesthesia Physical Anesthesia Plan  ASA: III  Anesthesia Plan: General   Post-op Pain Management:    Induction: Intravenous  PONV Risk Score and Plan: 3 and Treatment may vary due to age or medical condition, Ondansetron, Dexamethasone and Midazolam  Airway Management Planned: Oral ETT  Additional Equipment: None  Intra-op Plan:   Post-operative Plan: Extubation in OR  Informed Consent: I have reviewed the patients History and Physical, chart, labs and discussed the procedure including the risks, benefits and alternatives for the proposed anesthesia with the patient or authorized representative who has indicated his/her understanding and acceptance.     Dental advisory given  Plan Discussed with: CRNA and Anesthesiologist  Anesthesia Plan Comments: ( Small ETT)       Anesthesia Quick  Evaluation

## 2018-04-20 NOTE — Op Note (Signed)
OPERATIVE REPORT  DATE OF SURGERY: 04/20/2018  PATIENT:  Meagan Bridges,  53 y.o. female  PRE-OPERATIVE DIAGNOSIS: Chronic hoarseness, vocal cord mass specimen removed.  POST-OPERATIVE DIAGNOSIS: Same  PROCEDURE:  Procedure(s): MICROLARYNGOSCOPY with BX  SURGEON:  Beckie Salts, MD  ASSISTANTS: none  ANESTHESIA:   General   EBL:  3 ml  DRAINS: none  LOCAL MEDICATIONS USED: 1% Xylocaine with epinephrine  SPECIMEN: Left vocal cord biopsies  COUNTS:  Correct  PROCEDURE DETAILS: The patient was taken to the operating room and placed on the operating table in the supine position. Following induction of general endotracheal anesthesia, the table was turned 90 and the patient was draped in a standard fashion.  Maxilla was edentulous.  Dental protection was not needed.  Genitourinary scope was entered into the oral cavity used to visualize the larynx and attached to the Mayo stand with the suspension apparatus.  The operating microscope was brought into view.  The larynx was inspected.  There was thickening and abnormal mucosal change along the left vocal cord from the anterior aspect just behind the anterior commissure back towards the vocal process.  It did not seem to involve the subglottic mucosa and did not extend into the ventricle.  Local anesthetic was infiltrated with a spinal needle.  The lesion was removed in its entirety using small cup forceps.  It was sent for pathologic evaluation.  There is no residual abnormal mucosa present.  Topical adrenaline on pledgets was used for hemostasis.  The scope was removed.  Patient was awakened extubated transferred to recovery    PATIENT DISPOSITION:  To PACU, stable

## 2018-04-20 NOTE — Anesthesia Postprocedure Evaluation (Signed)
Anesthesia Post Note  Patient: Meagan Bridges  Procedure(s) Performed: MICROLARYNGOSCOPY with BIOPSY (N/A Throat)     Patient location during evaluation: PACU Anesthesia Type: General Level of consciousness: awake Pain management: pain level controlled Vital Signs Assessment: post-procedure vital signs reviewed and stable Respiratory status: spontaneous breathing Cardiovascular status: stable Postop Assessment: no apparent nausea or vomiting Anesthetic complications: no    Last Vitals:  Vitals:   04/20/18 1000 04/20/18 1015  BP: 119/65 121/63  Pulse: 88 89  Resp: (!) 23 19  Temp:    SpO2: 100% 98%    Last Pain:  Vitals:   04/20/18 1015  TempSrc:   PainSc: 0-No pain   Pain Goal:                   Huston Foley

## 2018-04-20 NOTE — Interval H&P Note (Signed)
History and Physical Interval Note:  04/20/2018 8:14 AM  Meagan Bridges  has presented today for surgery, with the diagnosis of chronic hoarseness.  The various methods of treatment have been discussed with the patient and family. After consideration of risks, benefits and other options for treatment, the patient has consented to  Procedure(s): MICROLARYNGOSCOPY with BX (N/A) as a surgical intervention.  The patient's history has been reviewed, patient examined, no change in status, stable for surgery.  I have reviewed the patient's chart and labs.  Questions were answered to the patient's satisfaction.     Izora Gala

## 2018-04-20 NOTE — Discharge Instructions (Signed)
Continue efforts to stop smoking.  Okay to use Tylenol/ibuprofen as needed for discomfort.  Avoid screaming, straining your voice, whispering.       Post Anesthesia Home Care Instructions  Activity: Get plenty of rest for the remainder of the day. A responsible individual must stay with you for 24 hours following the procedure.  For the next 24 hours, DO NOT: -Drive a car -Paediatric nurse -Drink alcoholic beverages -Take any medication unless instructed by your physician -Make any legal decisions or sign important papers.  Meals: Start with liquid foods such as gelatin or soup. Progress to regular foods as tolerated. Avoid greasy, spicy, heavy foods. If nausea and/or vomiting occur, drink only clear liquids until the nausea and/or vomiting subsides. Call your physician if vomiting continues.  Special Instructions/Symptoms: Your throat may feel dry or sore from the anesthesia or the breathing tube placed in your throat during surgery. If this causes discomfort, gargle with warm salt water. The discomfort should disappear within 24 hours.  If you had a scopolamine patch placed behind your ear for the management of post- operative nausea and/or vomiting:  1. The medication in the patch is effective for 72 hours, after which it should be removed.  Wrap patch in a tissue and discard in the trash. Wash hands thoroughly with soap and water. 2. You may remove the patch earlier than 72 hours if you experience unpleasant side effects which may include dry mouth, dizziness or visual disturbances. 3. Avoid touching the patch. Wash your hands with soap and water after contact with the patch.

## 2018-04-20 NOTE — Anesthesia Procedure Notes (Signed)
Procedure Name: Intubation Date/Time: 04/20/2018 8:59 AM Performed by: Raenette Rover, CRNA Pre-anesthesia Checklist: Patient identified, Emergency Drugs available, Suction available and Patient being monitored Patient Re-evaluated:Patient Re-evaluated prior to induction Oxygen Delivery Method: Circle system utilized Preoxygenation: Pre-oxygenation with 100% oxygen Induction Type: IV induction Ventilation: Mask ventilation without difficulty Laryngoscope Size: Mac and 3 Grade View: Grade I Tube type: Oral Tube size: 6.0 mm Number of attempts: 1 Airway Equipment and Method: Stylet Placement Confirmation: ETT inserted through vocal cords under direct vision,  positive ETCO2,  CO2 detector and breath sounds checked- equal and bilateral Secured at: 20 cm Tube secured with: Tape Dental Injury: Teeth and Oropharynx as per pre-operative assessment

## 2018-04-21 ENCOUNTER — Encounter (HOSPITAL_BASED_OUTPATIENT_CLINIC_OR_DEPARTMENT_OTHER): Payer: Self-pay | Admitting: Otolaryngology

## 2019-01-20 ENCOUNTER — Other Ambulatory Visit: Payer: Self-pay | Admitting: Certified Nurse Midwife

## 2019-01-20 DIAGNOSIS — Z1231 Encounter for screening mammogram for malignant neoplasm of breast: Secondary | ICD-10-CM

## 2019-03-05 ENCOUNTER — Ambulatory Visit
Admission: RE | Admit: 2019-03-05 | Discharge: 2019-03-05 | Disposition: A | Discharge status: Home / Self Care | Payer: BC Managed Care – PPO | Source: Ambulatory Visit | Attending: Certified Nurse Midwife | Admitting: Certified Nurse Midwife

## 2019-03-05 DIAGNOSIS — Z1231 Encounter for screening mammogram for malignant neoplasm of breast: Secondary | ICD-10-CM | POA: Diagnosis present

## 2019-07-02 ENCOUNTER — Other Ambulatory Visit: Payer: Self-pay | Admitting: Nurse Practitioner

## 2019-07-02 DIAGNOSIS — M542 Cervicalgia: Secondary | ICD-10-CM

## 2019-07-16 ENCOUNTER — Ambulatory Visit
Admission: RE | Admit: 2019-07-16 | Discharge: 2019-07-16 | Disposition: A | Discharge status: Home / Self Care | Payer: BC Managed Care – PPO | Source: Ambulatory Visit | Attending: Nurse Practitioner | Admitting: Nurse Practitioner

## 2019-07-16 ENCOUNTER — Other Ambulatory Visit: Payer: Self-pay

## 2019-07-16 DIAGNOSIS — M542 Cervicalgia: Secondary | ICD-10-CM | POA: Diagnosis not present

## 2019-10-05 ENCOUNTER — Other Ambulatory Visit: Payer: Self-pay | Admitting: Internal Medicine

## 2019-10-05 DIAGNOSIS — R10812 Left upper quadrant abdominal tenderness: Secondary | ICD-10-CM

## 2019-10-26 ENCOUNTER — Other Ambulatory Visit: Payer: Self-pay

## 2019-10-26 ENCOUNTER — Ambulatory Visit
Admission: RE | Admit: 2019-10-26 | Discharge: 2019-10-26 | Disposition: A | Discharge status: Home / Self Care | Payer: BC Managed Care – PPO | Source: Ambulatory Visit | Attending: Internal Medicine | Admitting: Internal Medicine

## 2019-10-26 DIAGNOSIS — R10812 Left upper quadrant abdominal tenderness: Secondary | ICD-10-CM | POA: Diagnosis not present

## 2019-10-26 MED ORDER — IOHEXOL 300 MG/ML  SOLN
100.0000 mL | Freq: Once | INTRAMUSCULAR | Status: AC | PRN
  Administered 2019-10-26: 100 mL via INTRAVENOUS

## 2020-01-20 ENCOUNTER — Encounter: Payer: Self-pay | Admitting: Oncology

## 2020-01-20 ENCOUNTER — Inpatient Hospital Stay: Payer: BC Managed Care – PPO | Attending: Oncology | Admitting: Oncology

## 2020-01-20 ENCOUNTER — Inpatient Hospital Stay: Payer: BC Managed Care – PPO

## 2020-01-20 VITALS — BP 125/64 | HR 100 | Temp 99.9°F | Resp 18 | Ht 63.0 in | Wt 167.6 lb

## 2020-01-20 DIAGNOSIS — D509 Iron deficiency anemia, unspecified: Secondary | ICD-10-CM | POA: Diagnosis present

## 2020-01-20 DIAGNOSIS — Z720 Tobacco use: Secondary | ICD-10-CM | POA: Diagnosis not present

## 2020-01-20 DIAGNOSIS — Z8349 Family history of other endocrine, nutritional and metabolic diseases: Secondary | ICD-10-CM

## 2020-01-20 DIAGNOSIS — R1310 Dysphagia, unspecified: Secondary | ICD-10-CM | POA: Diagnosis not present

## 2020-01-20 NOTE — Progress Notes (Signed)
Hematology/Oncology Consult note [facility] [phone] [phone]   Patient Care Team: Baxter Hire, MD as PCP - General (Internal Medicine)  REFERRING PROVIDER: Geanie Kenning, PA* CHIEF COMPLAINTS/REASON FOR VISIT:  Evaluation of iron deficiency anemia  HISTORY OF PRESENTING ILLNESS:  Meagan Bridges is a  54 y.o.  female with PMH listed below was seen in consultation at the request of Geanie Kenning, [place]*   for evaluation of iron deficiency anemia.   Reviewed patient's recent labs that are done at [facility] clinic. 01/19/2020 labs revealed anemia with hemoglobin of 8.1, MCV 72, hematocrit 28. Normal WBC and platelet count.   Reviewed patient's previous labs ordered by primary care physician's office, anemia is new onset, approximately from November 2021. Patient had a normal hemoglobin of 13.9 on 06/21/2019. She denies any black or bloody stool. She reports some dysphagia symptoms. History of GERD. She has been seen by another clinic gastroenterology and has scheduled for colonoscopy and  EGD.  Associated signs and symptoms: Patient reports fatigue and lack of energy.. Mild SOB with exertion.  Denies weight loss, easy bruising, hematochezia, hemoptysis, hematuria. Context:  History of iron deficiency: Denies Rectal bleeding: Denies Menstrual bleeding/ Vaginal bleeding : He has had endometrial ablation  Hematemesis or hemoptysis : denies Blood in urine : denies  Patient takes Eliquis for paroxysmal atrial fibrillation managed by Dr. Clayborn Bigness Patient smokes daily. Denies any alcohol use.   Review of Systems  Constitutional: Positive for fatigue. Negative for appetite change, chills and fever.  HENT:   Negative for hearing loss and voice change.   Eyes: Negative for eye problems.  Respiratory: Negative for chest tightness and cough.   Cardiovascular: Negative for chest pain.  Gastrointestinal: Negative for abdominal  distention, abdominal pain and blood in stool.       Dysphagia  Endocrine: Negative for hot flashes.  Genitourinary: Negative for difficulty urinating and frequency.   Musculoskeletal: Negative for arthralgias.  Skin: Negative for itching and rash.  Neurological: Negative for extremity weakness.  Hematological: Negative for adenopathy.  Psychiatric/Behavioral: Negative for confusion.    MEDICAL HISTORY:  Past Medical History:  Diagnosis Date  . Anxiety   . Back pain   . Chronic pain disorder   . COPD (chronic obstructive pulmonary disease) ([place])   . Depression   . Diabetes mellitus without complication ([place])   . Dysrhythmia    tachycardia  . GERD (gastroesophageal reflux disease)   . Heavy smoker    3-4ppd  . IBS (irritable bowel syndrome)   . Migraines     SURGICAL HISTORY: Past Surgical History:  Procedure Laterality Date  . BACK SURGERY    . CHOLECYSTECTOMY    . COLONOSCOPY WITH PROPOFOL N/A 03/11/2016   Procedure: COLONOSCOPY WITH PROPOFOL;  Surgeon: Manya Silvas, MD;  Location: [facility] ENDOSCOPY;  Service: Endoscopy;  Laterality: N/A;  . LUMBAR [place] SURGERY  2002  . MICROLARYNGOSCOPY N/A 04/20/2018   Procedure: MICROLARYNGOSCOPY with BIOPSY;  Surgeon: Izora Gala, MD;  Location: [place];  Service: ENT;  Laterality: N/A;  . UTERINE FIBROID SURGERY  2000    SOCIAL HISTORY: Social History   Socioeconomic History  . Marital status: Married    Spouse name: Not on file  . Number of children: Not on file  . Years of education: Not on file  . Highest education level: Not on file  Occupational History  . Not on file  Tobacco Use  . Smoking status: Current Every  Day Smoker    Packs/day: 2.00    Years: 40.00    Pack years: 80.00    Types: Cigarettes  . Smokeless tobacco: Never Used  Vaping Use  . Vaping Use: Never used  Substance and Sexual Activity  . Alcohol use: No    Alcohol/week: 0.0 standard drinks  . Drug use: No  . Sexual activity:  Yes    Birth control/protection: Post-menopausal  Other Topics Concern  . Not on file  Social History Narrative  . Not on file   Social Determinants of Health   Financial Resource Strain: Not on file  Food Insecurity: Not on file  Transportation Needs: Not on file  Physical Activity: Not on file  Stress: Not on file  Social Connections: Not on file  Intimate Partner Violence: Not on file    FAMILY HISTORY: Family History  Problem Relation Age of Onset  . Diabetes Mother   . Kidney disease Mother   . Miscarriages / [place] Mother   . Diabetes Father   . Early death Father   . Hearing loss Father   . Heart disease Father   . Hyperlipidemia Father   . Hypertension Father   . Learning disabilities Father   . Breast cancer Neg Hx     ALLERGIES:  is allergic to latex, niacin, and tape.  MEDICATIONS:  Current Outpatient Medications  Medication Sig Dispense Refill  . albuterol (PROVENTIL HFA;VENTOLIN HFA) 108 (90 Base) MCG/ACT inhaler Inhale into the lungs every 6 (six) hours as needed for wheezing or shortness of breath.    Marland Kitchen amitriptyline (ELAVIL) 25 MG tablet Take 100 mg by mouth at bedtime.  1  . apixaban (ELIQUIS) 5 MG TABS tablet Take 1 tablet by mouth every 12 (twelve) hours.    . Biotin 5000 MCG TABS Take 1 tablet by mouth daily.    Marland Kitchen buPROPion (WELLBUTRIN XL) 150 MG 24 hr tablet Take 150 mg by mouth daily.    . busPIRone (BUSPAR) 15 MG tablet Take 30 mg by mouth 3 (three) times daily.     . Cariprazine HCl 4.5 MG CAPS Take by mouth at bedtime.    . cholecalciferol (VITAMIN D3) 25 MCG (1000 UT) tablet Take 1,000 Units by mouth daily.    . cyclobenzaprine (FLEXERIL) 10 MG tablet Limit 1 tablet by mouth 2 - 3 times per day if tolerated 90 tablet 0  . diclofenac Sodium (VOLTAREN) 1 % GEL APPLY TWO TO FOUR GRAMS TO PAINFUL AREAS OF SKIN FOUR TIMES EACH DAY.    Marland Kitchen diltiazem (DILACOR XR) 180 MG 24 hr capsule Take 120 mg by mouth daily.     . Dulaglutide 0.75 MG/0.5ML  SOPN Inject 1.5 mg into the skin once a week.     . DULoxetine (CYMBALTA) 60 MG capsule Take 60 mg by mouth 2 (two) times daily.    . empagliflozin (JARDIANCE) 25 MG TABS tablet Take 25 mg by mouth daily.    Marland Kitchen esomeprazole (NEXIUM) 20 MG packet Take 20 mg by mouth daily before breakfast.    . fexofenadine (ALLEGRA) 180 MG tablet Take 180 mg by mouth daily.    . fluticasone (FLONASE) 50 MCG/ACT nasal spray Place into both nostrils daily.    Marland Kitchen gabapentin (NEURONTIN) 300 MG capsule Take 300 mg by mouth 3 (three) times daily.    Marland Kitchen glipiZIDE (GLUCOTROL) 10 MG tablet Take 10 mg by mouth 2 (two) times daily before a meal.    . HYDROcodone-acetaminophen (NORCO) 10-325 MG tablet Take  1 tablet by mouth every 6 (six) hours as needed for severe pain.    . magnesium gluconate (MAGONATE) 500 MG tablet Take by mouth.    . metFORMIN (GLUCOPHAGE) 500 MG tablet Take 1,000 mg by mouth 2 (two) times daily with a meal.     . methylphenidate (RITALIN) 20 MG tablet Take 20 mg by mouth daily.    Marland Kitchen omeprazole (PRILOSEC) 20 MG capsule Take 20 mg by mouth daily.    Marland Kitchen perphenazine (TRILAFON) 4 MG tablet Take 4 mg by mouth at bedtime.    Marland Kitchen tiZANidine (ZANAFLEX) 2 MG tablet LIMIT 1 TABLET BY MOUTH 2   4 TIMES PER DAY IF TOLERATED. NO CYCLOBENZAPRINE NO FLEXERIL    . umeclidinium-vilanterol (ANORO ELLIPTA) 62.5-25 MCG/INH AEPB Inhale 1 puff into the lungs daily.     No current facility-administered medications for this visit.     PHYSICAL EXAMINATION: ECOG PERFORMANCE STATUS: 1 - Symptomatic but completely ambulatory Vitals:   01/20/20 1326  BP: 125/64  Pulse: 100  Resp: 18  Temp: 99.9 F (37.7 C)   Filed Weights   01/20/20 1326  Weight: 167 lb 9.6 oz (76 kg)    Physical Exam Constitutional:      General: She is not in acute distress. HENT:     Head: Normocephalic and atraumatic.  Eyes:     General: No scleral icterus. Cardiovascular:     Rate and Rhythm: Normal rate and regular rhythm.     Heart  sounds: Normal heart sounds.  Pulmonary:     Effort: Pulmonary effort is normal. No respiratory distress.     Breath sounds: No wheezing.  Abdominal:     General: Bowel sounds are normal. There is no distension.     Palpations: Abdomen is soft.  Musculoskeletal:        General: No deformity. Normal range of motion.     Cervical back: Normal range of motion and neck supple.  Skin:    General: Skin is warm and dry.     Findings: No erythema or rash.  Neurological:     Mental Status: She is alert and oriented to person, place, and time. Mental status is at baseline.     Cranial Nerves: No cranial nerve deficit.     Coordination: Coordination normal.  Psychiatric:        Mood and Affect: Mood normal.       CMP Latest Ref Rng & Units 04/17/2018  Glucose 70 - 99 mg/dL 264(H)  BUN 6 - 20 mg/dL 9  Creatinine 0.44 - 1.00 mg/dL 1.08(H)  Sodium 135 - 145 mmol/L 136  Potassium 3.5 - 5.1 mmol/L 4.7  Chloride 98 - 111 mmol/L 104  CO2 22 - 32 mmol/L 22  Calcium 8.9 - 10.3 mg/dL 9.3  Total Protein 6.4 - 8.2 g/dL -  Total Bilirubin 0.2 - 1.0 mg/dL -  Alkaline Phos 50 - 136 Unit/L -  AST 15 - 37 Unit/L -  ALT 12 - 78 U/L -   CBC Latest Ref Rng & Units 05/17/2015  WBC 3.6 - 11.0 K/uL 9.9  Hemoglobin 12.0 - 16.0 g/dL 12.8  Hematocrit 35.0 - 47.0 % 36.4  Platelets 150 - 440 K/uL 285     LABORATORY DATA:  I have reviewed the data as listed Lab Results  Component Value Date   WBC 9.9 05/17/2015   HGB 12.8 05/17/2015   HCT 36.4 05/17/2015   MCV 94.1 05/17/2015   PLT 285 05/17/2015   No  results for input(s): NA, K, CL, CO2, GLUCOSE, BUN, CREATININE, CALCIUM, GFRNONAA, GFRAA, PROT, ALBUMIN, AST, ALT, ALKPHOS, BILITOT, BILIDIR, IBILI in the last 8760 hours. Iron/TIBC/Ferritin/ %Sat No results found for: IRON, TIBC, FERRITIN, IRONPCTSAT   RADIOGRAPHIC STUDIES: I have personally reviewed the radiological images as listed and agreed with the findings in the report. CT ABDOMEN PELVIS W  CONTRAST  Result Date: 10/26/2019 CLINICAL DATA:  Left upper quadrant pain EXAM: CT ABDOMEN AND PELVIS WITH CONTRAST TECHNIQUE: Multidetector CT imaging of the abdomen and pelvis was performed using the standard protocol following bolus administration of intravenous contrast. CONTRAST:  171mL OMNIPAQUE IOHEXOL 300 MG/ML  SOLN COMPARISON:  None. FINDINGS: Lower chest: Lung bases are clear. No effusions. Heart is normal size. Hepatobiliary: Prior cholecystectomy. Diffuse fatty infiltration of the liver. No focal hepatic abnormality. Pancreas: No focal abnormality or ductal dilatation. Spleen: No focal abnormality.  Normal size. Adrenals/Urinary Tract: No adrenal abnormality. No focal renal abnormality. No stones or hydronephrosis. Urinary bladder is unremarkable. Stomach/Bowel: Moderate stool burden throughout the colon. Normal appendix. Stomach, large and small bowel grossly unremarkable. Vascular/Lymphatic: Scattered aortic atherosclerosis. No evidence of aneurysm or adenopathy. Reproductive: Uterus and adnexa unremarkable.  No mass. Other: No free fluid or free air. Musculoskeletal: No acute bony abnormality. IMPRESSION: Fatty infiltration of the liver. Moderate stool burden throughout the colon. Scattered aortic atherosclerosis. Electronically Signed   By: Rolm Baptise M.D.   On: 10/26/2019 12:08       ASSESSMENT & PLAN:  1. Tobacco use   2. Iron deficiency anemia, unspecified iron deficiency anemia type   3. Dysphagia, unspecified type    Labs are reviewed and discussed with patient. Consistent with iron deficiency anemia. I discussed with her about options of oral iron supplementation versus IV Venofer treatments. Plan IV iron with Venofer 200mg  weekly x 4 doses. Allergy reactions/infusion reaction including anaphylactic reaction discussed with patient. Other side effects include but not limited to high blood pressure, skin rash, weight gain, leg swelling, etc. Patient voices understanding and  willing to proceed with IV Venofer infusion.Marland Kitchen  Dysphagia, pending colonoscopy and EGD work-up. Tobacco use, smoke cessation was discussed with patient. 80-pack-year smoking history, recommend patient to have lung cancer screening done. Refer to lung cancer screening program. Patient wants to defer now, and think about it and update me during next visit.  Orders Placed This Encounter  Procedures  . CBC with Differential/Platelet    Standing Status:   Future    Standing Expiration Date:   01/19/2021  . Ferritin    Standing Status:   Future    Standing Expiration Date:   01/19/2021  . Iron and TIBC    Standing Status:   Future    Standing Expiration Date:   01/19/2021    All questions were answered. The patient knows to call the clinic with any problems questions or concerns.  Cc [place], [place], [place]*  Return of visit: 8 weeks Thank you for this kind referral and the opportunity to participate in the care of this patient. A copy of today's note is routed to referring provider   Earlie Server, MD, PhD Hematology Oncology [place] at [facility] 01/20/2020

## 2020-01-20 NOTE — Progress Notes (Signed)
Pt here to establish care.

## 2020-01-24 ENCOUNTER — Other Ambulatory Visit: Payer: Self-pay

## 2020-01-24 ENCOUNTER — Inpatient Hospital Stay: Payer: BC Managed Care – PPO

## 2020-01-24 ENCOUNTER — Other Ambulatory Visit
Admission: RE | Admit: 2020-01-24 | Discharge: 2020-01-24 | Disposition: A | Discharge status: Home / Self Care | Payer: BC Managed Care – PPO | Source: Ambulatory Visit | Attending: Internal Medicine | Admitting: Internal Medicine

## 2020-01-24 VITALS — BP 114/66 | HR 80 | Temp 97.1°F | Resp 20

## 2020-01-24 DIAGNOSIS — Z7951 Long term (current) use of inhaled steroids: Secondary | ICD-10-CM | POA: Diagnosis not present

## 2020-01-24 DIAGNOSIS — K449 Diaphragmatic hernia without obstruction or gangrene: Secondary | ICD-10-CM | POA: Diagnosis not present

## 2020-01-24 DIAGNOSIS — Z01812 Encounter for preprocedural laboratory examination: Secondary | ICD-10-CM | POA: Insufficient documentation

## 2020-01-24 DIAGNOSIS — Z888 Allergy status to other drugs, medicaments and biological substances status: Secondary | ICD-10-CM | POA: Diagnosis not present

## 2020-01-24 DIAGNOSIS — K589 Irritable bowel syndrome without diarrhea: Secondary | ICD-10-CM | POA: Diagnosis not present

## 2020-01-24 DIAGNOSIS — R1310 Dysphagia, unspecified: Secondary | ICD-10-CM | POA: Diagnosis not present

## 2020-01-24 DIAGNOSIS — J449 Chronic obstructive pulmonary disease, unspecified: Secondary | ICD-10-CM | POA: Diagnosis not present

## 2020-01-24 DIAGNOSIS — K222 Esophageal obstruction: Secondary | ICD-10-CM | POA: Diagnosis not present

## 2020-01-24 DIAGNOSIS — D509 Iron deficiency anemia, unspecified: Secondary | ICD-10-CM | POA: Diagnosis present

## 2020-01-24 DIAGNOSIS — Z91048 Other nonmedicinal substance allergy status: Secondary | ICD-10-CM | POA: Diagnosis not present

## 2020-01-24 DIAGNOSIS — Z7984 Long term (current) use of oral hypoglycemic drugs: Secondary | ICD-10-CM | POA: Diagnosis not present

## 2020-01-24 DIAGNOSIS — Z791 Long term (current) use of non-steroidal anti-inflammatories (NSAID): Secondary | ICD-10-CM | POA: Diagnosis not present

## 2020-01-24 DIAGNOSIS — F17200 Nicotine dependence, unspecified, uncomplicated: Secondary | ICD-10-CM | POA: Diagnosis not present

## 2020-01-24 DIAGNOSIS — K640 First degree hemorrhoids: Secondary | ICD-10-CM | POA: Diagnosis not present

## 2020-01-24 DIAGNOSIS — Z20822 Contact with and (suspected) exposure to covid-19: Secondary | ICD-10-CM | POA: Insufficient documentation

## 2020-01-24 DIAGNOSIS — D123 Benign neoplasm of transverse colon: Secondary | ICD-10-CM | POA: Diagnosis not present

## 2020-01-24 DIAGNOSIS — Z7901 Long term (current) use of anticoagulants: Secondary | ICD-10-CM | POA: Diagnosis not present

## 2020-01-24 DIAGNOSIS — E119 Type 2 diabetes mellitus without complications: Secondary | ICD-10-CM | POA: Diagnosis not present

## 2020-01-24 DIAGNOSIS — Z91040 Latex allergy status: Secondary | ICD-10-CM | POA: Diagnosis not present

## 2020-01-24 DIAGNOSIS — Z79899 Other long term (current) drug therapy: Secondary | ICD-10-CM | POA: Diagnosis not present

## 2020-01-24 DIAGNOSIS — K2970 Gastritis, unspecified, without bleeding: Secondary | ICD-10-CM | POA: Diagnosis not present

## 2020-01-24 DIAGNOSIS — K219 Gastro-esophageal reflux disease without esophagitis: Secondary | ICD-10-CM | POA: Diagnosis not present

## 2020-01-24 LAB — SARS CORONAVIRUS 2 (TAT 6-24 HRS): SARS Coronavirus 2: NEGATIVE

## 2020-01-24 MED ORDER — SODIUM CHLORIDE 0.9 % IV SOLN
200.0000 mg | Freq: Once | INTRAVENOUS | Status: DC
  Filled 2020-01-24: qty 10

## 2020-01-24 MED ORDER — SODIUM CHLORIDE 0.9 % IV SOLN
Freq: Once | INTRAVENOUS | Status: AC
  Filled 2020-01-24: qty 250

## 2020-01-24 MED ORDER — IRON SUCROSE 20 MG/ML IV SOLN
200.0000 mg | Freq: Once | INTRAVENOUS | Status: AC
  Administered 2020-01-24: 200 mg via INTRAVENOUS
  Filled 2020-01-24: qty 10

## 2020-01-24 NOTE — Progress Notes (Signed)
Stable at discharge 

## 2020-01-25 ENCOUNTER — Encounter: Payer: Self-pay | Admitting: Internal Medicine

## 2020-01-26 ENCOUNTER — Ambulatory Visit: Payer: BC Managed Care – PPO | Admitting: Anesthesiology

## 2020-01-26 ENCOUNTER — Other Ambulatory Visit: Payer: Self-pay

## 2020-01-26 ENCOUNTER — Ambulatory Visit
Admission: RE | Admit: 2020-01-26 | Discharge: 2020-01-26 | Disposition: A | Discharge status: Home / Self Care | Payer: BC Managed Care – PPO | Attending: Internal Medicine | Admitting: Internal Medicine

## 2020-01-26 ENCOUNTER — Encounter
Admission: RE | Disposition: A | Discharge status: Home / Self Care | Payer: Self-pay | Source: Home / Self Care | Attending: Internal Medicine

## 2020-01-26 ENCOUNTER — Encounter: Payer: Self-pay | Admitting: Internal Medicine

## 2020-01-26 DIAGNOSIS — Z91040 Latex allergy status: Secondary | ICD-10-CM | POA: Insufficient documentation

## 2020-01-26 DIAGNOSIS — E119 Type 2 diabetes mellitus without complications: Secondary | ICD-10-CM | POA: Insufficient documentation

## 2020-01-26 DIAGNOSIS — D123 Benign neoplasm of transverse colon: Secondary | ICD-10-CM | POA: Insufficient documentation

## 2020-01-26 DIAGNOSIS — Z791 Long term (current) use of non-steroidal anti-inflammatories (NSAID): Secondary | ICD-10-CM | POA: Insufficient documentation

## 2020-01-26 DIAGNOSIS — Z7901 Long term (current) use of anticoagulants: Secondary | ICD-10-CM | POA: Insufficient documentation

## 2020-01-26 DIAGNOSIS — Z91048 Other nonmedicinal substance allergy status: Secondary | ICD-10-CM | POA: Insufficient documentation

## 2020-01-26 DIAGNOSIS — K219 Gastro-esophageal reflux disease without esophagitis: Secondary | ICD-10-CM | POA: Insufficient documentation

## 2020-01-26 DIAGNOSIS — K640 First degree hemorrhoids: Secondary | ICD-10-CM | POA: Insufficient documentation

## 2020-01-26 DIAGNOSIS — D509 Iron deficiency anemia, unspecified: Secondary | ICD-10-CM | POA: Diagnosis not present

## 2020-01-26 DIAGNOSIS — F17200 Nicotine dependence, unspecified, uncomplicated: Secondary | ICD-10-CM | POA: Insufficient documentation

## 2020-01-26 DIAGNOSIS — Z7984 Long term (current) use of oral hypoglycemic drugs: Secondary | ICD-10-CM | POA: Insufficient documentation

## 2020-01-26 DIAGNOSIS — Z20822 Contact with and (suspected) exposure to covid-19: Secondary | ICD-10-CM | POA: Insufficient documentation

## 2020-01-26 DIAGNOSIS — K222 Esophageal obstruction: Secondary | ICD-10-CM | POA: Insufficient documentation

## 2020-01-26 DIAGNOSIS — Z79899 Other long term (current) drug therapy: Secondary | ICD-10-CM | POA: Insufficient documentation

## 2020-01-26 DIAGNOSIS — Z888 Allergy status to other drugs, medicaments and biological substances status: Secondary | ICD-10-CM | POA: Insufficient documentation

## 2020-01-26 DIAGNOSIS — J449 Chronic obstructive pulmonary disease, unspecified: Secondary | ICD-10-CM | POA: Insufficient documentation

## 2020-01-26 DIAGNOSIS — K449 Diaphragmatic hernia without obstruction or gangrene: Secondary | ICD-10-CM | POA: Insufficient documentation

## 2020-01-26 DIAGNOSIS — R1310 Dysphagia, unspecified: Secondary | ICD-10-CM | POA: Insufficient documentation

## 2020-01-26 DIAGNOSIS — K2970 Gastritis, unspecified, without bleeding: Secondary | ICD-10-CM | POA: Insufficient documentation

## 2020-01-26 DIAGNOSIS — Z7951 Long term (current) use of inhaled steroids: Secondary | ICD-10-CM | POA: Insufficient documentation

## 2020-01-26 DIAGNOSIS — K589 Irritable bowel syndrome without diarrhea: Secondary | ICD-10-CM | POA: Insufficient documentation

## 2020-01-26 HISTORY — PX: ESOPHAGOGASTRODUODENOSCOPY: SHX5428

## 2020-01-26 HISTORY — PX: COLONOSCOPY WITH PROPOFOL: SHX5780

## 2020-01-26 LAB — GLUCOSE, CAPILLARY: Glucose-Capillary: 92 mg/dL (ref 70–99)

## 2020-01-26 SURGERY — EGD (ESOPHAGOGASTRODUODENOSCOPY)
Anesthesia: General

## 2020-01-26 MED ORDER — MIDAZOLAM HCL 5 MG/5ML IJ SOLN
INTRAMUSCULAR | Status: DC | PRN
  Administered 2020-01-26: 2 mg via INTRAVENOUS

## 2020-01-26 MED ORDER — PROPOFOL 500 MG/50ML IV EMUL
INTRAVENOUS | Status: DC | PRN
  Administered 2020-01-26: 50 ug/kg/min via INTRAVENOUS

## 2020-01-26 MED ORDER — FENTANYL CITRATE (PF) 100 MCG/2ML IJ SOLN
INTRAMUSCULAR | Status: DC | PRN
  Administered 2020-01-26 (×2): 50 ug via INTRAVENOUS

## 2020-01-26 MED ORDER — PROPOFOL 10 MG/ML IV BOLUS
INTRAVENOUS | Status: DC | PRN
  Administered 2020-01-26: 30 mg via INTRAVENOUS
  Administered 2020-01-26: 20 mg via INTRAVENOUS

## 2020-01-26 MED ORDER — FENTANYL CITRATE (PF) 100 MCG/2ML IJ SOLN
INTRAMUSCULAR | Status: AC
  Filled 2020-01-26: qty 2

## 2020-01-26 MED ORDER — MIDAZOLAM HCL 2 MG/2ML IJ SOLN
INTRAMUSCULAR | Status: AC
  Filled 2020-01-26: qty 2

## 2020-01-26 MED ORDER — LIDOCAINE HCL (PF) 2 % IJ SOLN
INTRAMUSCULAR | Status: DC | PRN
  Administered 2020-01-26: 60 mg

## 2020-01-26 MED ORDER — SODIUM CHLORIDE 0.9 % IV SOLN: INTRAVENOUS | Status: DC

## 2020-01-26 NOTE — Op Note (Signed)
[facility] Gastroenterology Patient Name: Meagan Bridges Procedure Date: 01/26/2020 1:27 PM MRN: [id number] Account #: [id number] Date of Birth: 09/07/1965 Admit Type: Outpatient Age: 54 Room: [facility] ENDO ROOM 2 Gender: Female Note Status: Finalized Procedure:             Upper GI endoscopy Indications:           Unexplained iron deficiency anemia, Dysphagia Providers:             Benay Pike. Alice Reichert MD, MD Referring MD:          Baxter Hire, MD (Referring MD) Medicines:             Propofol per Anesthesia Complications:         No immediate complications. Procedure:             Pre-Anesthesia Assessment:                        - The risks and benefits of the procedure and the                         sedation options and risks were discussed with the                         patient. All questions were answered and informed                         consent was obtained.                        - Patient identification and proposed procedure were                         verified prior to the procedure by the nurse. The                         procedure was verified in the procedure room.                        - ASA Grade Assessment: III - A patient with severe                         systemic disease.                        - After reviewing the risks and benefits, the patient                         was deemed in satisfactory condition to undergo the                         procedure.                        After obtaining informed consent, the endoscope was                         passed under direct vision. Throughout the procedure,                         the patient's  blood pressure, pulse, and oxygen                         saturations were monitored continuously. The Endoscope                         was introduced through the mouth, and advanced to the                         third part of duodenum. The upper GI endoscopy was                          accomplished without difficulty. The patient tolerated                         the procedure well. Findings:      A mild Schatzki ring was found in the distal esophagus. A guidewire was       placed and the scope was withdrawn. Dilation was performed with a Savary       dilator with no resistance at 54 Fr.      A 3 cm hiatal hernia was present.      Scattered moderate inflammation characterized by congestion (edema),       erosions and erythema was found in the gastric antrum.      The examined duodenum was normal.      The exam was otherwise without abnormality. Impression:            - Mild Schatzki ring. Dilated.                        - 3 cm hiatal hernia.                        - Gastritis.                        - Normal examined duodenum.                        - The examination was otherwise normal.                        - No specimens collected. Recommendation:        - Monitor results to esophageal dilation                        - Proceed with colonoscopy Procedure Code(s):     --- Professional ---                        [phone], Esophagogastroduodenoscopy, flexible,                         transoral; with insertion of guide wire followed by                         passage of dilator(s) through esophagus over guide wire Diagnosis Code(s):     --- Professional ---                        R13.10, Dysphagia, unspecified  D50.9, Iron deficiency anemia, unspecified                        K29.70, Gastritis, unspecified, without bleeding                        K44.9, Diaphragmatic hernia without obstruction or                         gangrene                        K22.2, Esophageal obstruction CPT copyright 2019 American Medical Association. All rights reserved. The codes documented in this report are preliminary and upon coder review may  be revised to meet current compliance requirements. Efrain Sella MD, MD 01/26/2020 2:03:59 PM This report has been  signed electronically. Number of Addenda: 0 Note Initiated On: 01/26/2020 1:27 PM Estimated Blood Loss:  Estimated blood loss: none.      [facility]

## 2020-01-26 NOTE — Anesthesia Preprocedure Evaluation (Signed)
Anesthesia Evaluation  Patient identified by MRN, date of birth, ID band Patient awake    Reviewed: Allergy & Precautions, H&P , NPO status , Patient's Chart, lab work & pertinent test results, reviewed documented beta blocker date and time   Airway Mallampati: II   Neck ROM: full    Dental  (+) Poor Dentition   Pulmonary COPD, Current Smoker,    Pulmonary exam normal        Cardiovascular Exercise Tolerance: Poor + dysrhythmias Supra Ventricular Tachycardia  Rhythm:regular Rate:Normal     Neuro/Psych  Headaches,  Neuromuscular disease negative psych ROS   GI/Hepatic Neg liver ROS, GERD  Medicated,  Endo/Other  negative endocrine ROSdiabetes, Well Controlled, Type 2  Renal/GU negative Renal ROS  negative genitourinary   Musculoskeletal   Abdominal   Peds  Hematology  (+) Blood dyscrasia, anemia ,   Anesthesia Other Findings Past Medical History: No date: Anxiety No date: Back pain No date: Chronic pain disorder No date: COPD (chronic obstructive pulmonary disease) ([facility]) No date: Depression No date: Diabetes mellitus without complication ([facility]) No date: Dysrhythmia     Comment:  tachycardia No date: GERD (gastroesophageal reflux disease) No date: Heavy smoker     Comment:  3-4ppd No date: IBS (irritable bowel syndrome) No date: Migraines Past Surgical History: No date: BACK SURGERY No date: CHOLECYSTECTOMY 03/11/2016: COLONOSCOPY WITH PROPOFOL; N/A     Comment:  Procedure: COLONOSCOPY WITH PROPOFOL;  Surgeon: Manya Silvas, MD;  Location: [facility] ENDOSCOPY;  Service:               Endoscopy;  Laterality: N/A; 2002: LUMBAR [place] SURGERY 04/20/2018: MICROLARYNGOSCOPY; N/A     Comment:  Procedure: MICROLARYNGOSCOPY with BIOPSY;  Surgeon:               Izora Gala, MD;  Location: [place];                Service: ENT;  Laterality: N/A; 2000: UTERINE FIBROID SURGERY    Reproductive/Obstetrics negative OB ROS                             Anesthesia Physical Anesthesia Plan  ASA: III  Anesthesia Plan: General   Post-op Pain Management:    Induction:   PONV Risk Score and Plan:   Airway Management Planned:   Additional Equipment:   Intra-op Plan:   Post-operative Plan:   Informed Consent: I have reviewed the patients History and Physical, chart, labs and discussed the procedure including the risks, benefits and alternatives for the proposed anesthesia with the patient or authorized representative who has indicated his/her understanding and acceptance.     Dental Advisory Given  Plan Discussed with: CRNA  Anesthesia Plan Comments:         Anesthesia Quick Evaluation

## 2020-01-26 NOTE — Interval H&P Note (Signed)
History and Physical Interval Note:  01/26/2020 1:51 PM  Meagan Bridges  has presented today for surgery, with the diagnosis of IRON DEF.ANEMIA.  The various methods of treatment have been discussed with the patient and family. After consideration of risks, benefits and other options for treatment, the patient has consented to  Procedure(s): ESOPHAGOGASTRODUODENOSCOPY (EGD) (N/A) COLONOSCOPY WITH PROPOFOL (N/A) as a surgical intervention.  The patient's history has been reviewed, patient examined, no change in status, stable for surgery.  I have reviewed the patient's chart and labs.  Questions were answered to the patient's satisfaction.     [place], [place]

## 2020-01-26 NOTE — H&P (Signed)
Outpatient short stay form Pre-procedure 01/26/2020 1:45 PM Meagan Bridges K. Alice Reichert, M.D.  Primary Physician: Harrel Lemon, M.D.  Reason for visit: Iron deficiency anemia, dysphagia, GERD  History of present illness: .54 y/o female with hx if Iron deficiency and history of dysphagia, GERD. NO abdominal pain. No hematochezia. EGD and colonoscopy in 2018 essentially unremarkable.  Patient takes Eliquis but has held it approved by her cardiologist x 3 days prior to the procedures.    Current Facility-Administered Medications:  .  0.9 %  sodium chloride infusion, , Intravenous, Continuous, [place], Benay Pike, MD, Last Rate: 20 mL/hr at 01/26/20 1341, Continued from Pre-op at 01/26/20 1341  Medications Prior to Admission  Medication Sig Dispense Refill Last Dose  . albuterol (PROVENTIL HFA;VENTOLIN HFA) 108 (90 Base) MCG/ACT inhaler Inhale into the lungs every 6 (six) hours as needed for wheezing or shortness of breath.   Past Week at Unknown time  . amitriptyline (ELAVIL) 25 MG tablet Take 100 mg by mouth at bedtime.  1 01/25/2020 at 2200  . Biotin 5000 MCG TABS Take 1 tablet by mouth daily.   01/25/2020 at 0800  . buPROPion (WELLBUTRIN XL) 150 MG 24 hr tablet Take 150 mg by mouth daily.   01/25/2020 at 0800  . busPIRone (BUSPAR) 15 MG tablet Take 30 mg by mouth 3 (three) times daily.    01/25/2020 at 0800  . Cariprazine HCl 4.5 MG CAPS Take by mouth at bedtime.   01/25/2020 at Unknown time  . cholecalciferol (VITAMIN D3) 25 MCG (1000 UT) tablet Take 1,000 Units by mouth daily.   01/25/2020 at 0800  . diclofenac Sodium (VOLTAREN) 1 % GEL APPLY TWO TO FOUR GRAMS TO PAINFUL AREAS OF SKIN FOUR TIMES EACH DAY.   01/25/2020 at 0800  . diltiazem (DILACOR XR) 180 MG 24 hr capsule Take 120 mg by mouth daily.    01/25/2020 at 0800  . Dulaglutide 0.75 MG/0.5ML SOPN Inject 1.5 mg into the skin once a week.    01/25/2020 at 0800  . DULoxetine (CYMBALTA) 60 MG capsule Take 60 mg by mouth 2 (two) times daily.    01/25/2020 at 0800  . empagliflozin (JARDIANCE) 25 MG TABS tablet Take 25 mg by mouth daily.   01/25/2020 at 0800  . esomeprazole (NEXIUM) 20 MG packet Take 20 mg by mouth daily before breakfast.   01/25/2020 at 0800  . fexofenadine (ALLEGRA) 180 MG tablet Take 180 mg by mouth daily.   01/25/2020 at 0800  . gabapentin (NEURONTIN) 300 MG capsule Take 400 mg by mouth 3 (three) times daily.   01/25/2020 at 0800  . glipiZIDE (GLUCOTROL) 10 MG tablet Take 10 mg by mouth 2 (two) times daily before a meal.   01/25/2020 at 0800  . HYDROcodone-acetaminophen (NORCO) 10-325 MG tablet Take 1 tablet by mouth every 6 (six) hours as needed for severe pain.   01/25/2020 at 0800  . magnesium gluconate (MAGONATE) 500 MG tablet Take by mouth.   01/25/2020 at 0800  . metFORMIN (GLUCOPHAGE) 500 MG tablet Take 1,000 mg by mouth 2 (two) times daily with a meal.    01/25/2020 at 0800  . methylphenidate (RITALIN) 20 MG tablet Take 20 mg by mouth daily.   01/25/2020 at 0800  . omeprazole (PRILOSEC) 20 MG capsule Take 20 mg by mouth daily.   01/25/2020 at 0800  . perphenazine (TRILAFON) 4 MG tablet Take 4 mg by mouth at bedtime.   01/25/2020 at 0800  . tiZANidine (ZANAFLEX) 2 MG tablet LIMIT 1  TABLET BY MOUTH 2   4 TIMES PER DAY IF TOLERATED. NO CYCLOBENZAPRINE NO FLEXERIL   01/25/2020 at 0800  . umeclidinium-vilanterol (ANORO ELLIPTA) 62.5-25 MCG/INH AEPB Inhale 1 puff into the lungs daily.   01/25/2020 at 0800  . apixaban (ELIQUIS) 5 MG TABS tablet Take 1 tablet by mouth every 12 (twelve) hours.   01/23/2020 at Unknown time  . cyclobenzaprine (FLEXERIL) 10 MG tablet Limit 1 tablet by mouth 2 - 3 times per day if tolerated (Patient not taking: Reported on 01/26/2020) 90 tablet 0 Not Taking at Unknown time  . fluticasone (FLONASE) 50 MCG/ACT nasal spray Place into both nostrils daily. (Patient not taking: Reported on 01/26/2020)   Not Taking at Unknown time     Allergies  Allergen Reactions  . Latex     Other  reaction(s): Blister of skin AND/OR mucosa (finding), Pruritic rash  . Niacin     Other reaction(s): Other (qualifier value) Patient does not remember  . Tape     Other reaction(s): Blister of skin AND/OR mucosa (finding) CAN USE PAPER TAPE     Past Medical History:  Diagnosis Date  . Anxiety   . Back pain   . Chronic pain disorder   . COPD (chronic obstructive pulmonary disease) ([place])   . Depression   . Diabetes mellitus without complication ([place])   . Dysrhythmia    tachycardia  . GERD (gastroesophageal reflux disease)   . Heavy smoker    3-4ppd  . IBS (irritable bowel syndrome)   . Migraines     Review of systems:  Otherwise negative.    Physical Exam  Gen: Alert, oriented. Appears stated age.  HEENT: [place]/AT. PERRLA. Lungs: CTA, no wheezes. CV: RR nl S1, S2. Abd: soft, benign, no masses. BS+ Ext: No edema. Pulses 2+    Planned procedures: Proceed with EGD and colonoscopy. The patient understands the nature of the planned procedure, indications, risks, alternatives and potential complications including but not limited to bleeding, infection, perforation, damage to internal organs and possible oversedation/side effects from anesthesia. The patient agrees and gives consent to proceed.  Please refer to procedure notes for findings, recommendations and patient disposition/instructions.     Bobby Ragan K. Alice Reichert, M.D. Gastroenterology 01/26/2020  1:45 PM

## 2020-01-26 NOTE — Transfer of Care (Signed)
Immediate Anesthesia Transfer of Care Note  Patient: Meagan Bridges  Procedure(s) Performed: ESOPHAGOGASTRODUODENOSCOPY (EGD) (N/A ) COLONOSCOPY WITH PROPOFOL (N/A )  Patient Location: PACU  Anesthesia Type:General  Level of Consciousness: sedated  Airway & Oxygen Therapy: Patient Spontanous Breathing and Patient connected to nasal cannula oxygen  Post-op Assessment: Report given to RN and Post -op Vital signs reviewed and stable  Post vital signs: Reviewed and stable  Last Vitals:  Vitals Value Taken Time  BP 121/67 01/26/20 1427  Temp 36.7 C 01/26/20 1427  Pulse 85 01/26/20 1434  Resp 17 01/26/20 1434  SpO2 100 % 01/26/20 1434  Vitals shown include unvalidated device data.  Last Pain:  Vitals:   01/26/20 1427  TempSrc:   PainSc: Asleep         Complications: No complications documented.

## 2020-01-26 NOTE — Op Note (Signed)
[facility] Gastroenterology Patient Name: Meagan Bridges Procedure Date: 01/26/2020 1:26 PM MRN: [id number] Account #: [id number] Date of Birth: [birth year] Admit Type: Outpatient Age: 54 Room: [facility] ENDO ROOM 2 Gender: Female Note Status: Finalized Procedure:             Colonoscopy Indications:           Unexplained iron deficiency anemia Providers:             Benay Pike. Alice Reichert MD, MD Referring MD:          Baxter Hire, MD (Referring MD) Medicines:             Propofol per Anesthesia Complications:         No immediate complications. Procedure:             Pre-Anesthesia Assessment:                        - The risks and benefits of the procedure and the                         sedation options and risks were discussed with the                         patient. All questions were answered and informed                         consent was obtained.                        - Patient identification and proposed procedure were                         verified prior to the procedure by the nurse. The                         procedure was verified in the procedure room.                        - After reviewing the risks and benefits, the patient                         was deemed in satisfactory condition to undergo the                         procedure.                        - ASA Grade Assessment: III - A patient with severe                         systemic disease.                        - After reviewing the risks and benefits, the patient                         was deemed in satisfactory condition to undergo the                         procedure.  After obtaining informed consent, the colonoscope was                         passed under direct vision. Throughout the procedure,                         the patient's blood pressure, pulse, and oxygen                         saturations were monitored continuously. The                          Colonoscope was introduced through the anus and                         advanced to the the cecum, identified by appendiceal                         orifice and ileocecal valve. The colonoscopy was                         performed without difficulty. The patient tolerated                         the procedure well. The quality of the bowel                         preparation was good. The ileocecal valve, appendiceal                         orifice, and rectum were photographed. Findings:      The perianal and digital rectal examinations were normal. Pertinent       negatives include normal sphincter tone and no palpable rectal lesions.      Non-bleeding internal hemorrhoids were found during retroflexion. The       hemorrhoids were Grade I (internal hemorrhoids that do not prolapse).      Two sessile polyps were found in the hepatic flexure. The polyps were 4       to 7 mm in size. These polyps were removed with a cold snare. Resection       and retrieval were complete.      The exam was otherwise without abnormality on direct and retroflexion       views. Impression:            - Non-bleeding internal hemorrhoids.                        - Two 4 to 7 mm polyps at the hepatic flexure, removed                         with a cold snare. Resected and retrieved.                        - The examination was otherwise normal on direct and                         retroflexion views. Recommendation:        - Patient has a contact number available  for                         emergencies. The signs and symptoms of potential                         delayed complications were discussed with the patient.                         Return to normal activities tomorrow. Written                         discharge instructions were provided to the patient.                        - Resume previous diet.                        - Continue present medications.                        - Repeat colonoscopy is  recommended for surveillance.                         The colonoscopy date will be determined after                         pathology results from today's exam become available                         for review.                        - To visualize the small bowel, perform video capsule                         endoscopy at appointment to be scheduled.                        - Return to physician assistant in 2 months.                        - Follow up with Octavia Bruckner, PA-C in [ ]  months.                        - The findings and recommendations were discussed with                         the patient. Procedure Code(s):     --- Professional ---                        [phone], Colonoscopy, flexible; with removal of                         tumor(s), polyp(s), or other lesion(s) by snare                         technique Diagnosis Code(s):     --- Professional ---  D50.9, Iron deficiency anemia, unspecified                        K64.0, First degree hemorrhoids                        K63.5, Polyp of colon CPT copyright 2019 [employer] Medical Association. All rights reserved. The codes documented in this report are preliminary and upon coder review may  be revised to meet current compliance requirements. Efrain Sella MD, MD 01/26/2020 2:30:22 PM This report has been signed electronically. Number of Addenda: 0 Note Initiated On: 01/26/2020 1:26 PM Scope Withdrawal Time: 0 hours 5 minutes 23 seconds  Total Procedure Duration: 0 hours 17 minutes 22 seconds  Estimated Blood Loss:  Estimated blood loss: none.      [facility]

## 2020-01-26 NOTE — Interval H&P Note (Signed)
History and Physical Interval Note:  01/26/2020 1:51 PM  Meagan Bridges  has presented today for surgery, with the diagnosis of IRON DEF.ANEMIA.  The various methods of treatment have been discussed with the patient and family. After consideration of risks, benefits and other options for treatment, the patient has consented to  Procedure(s): ESOPHAGOGASTRODUODENOSCOPY (EGD) (N/A) COLONOSCOPY WITH PROPOFOL (N/A) as a surgical intervention.  The patient's history has been reviewed, patient examined, no change in status, stable for surgery.  I have reviewed the patient's chart and labs.  Questions were answered to the patient's satisfaction.     [place], [place]

## 2020-01-27 ENCOUNTER — Encounter: Payer: Self-pay | Admitting: Internal Medicine

## 2020-01-28 LAB — SURGICAL PATHOLOGY

## 2020-01-28 NOTE — Anesthesia Postprocedure Evaluation (Signed)
Anesthesia Post Note  Patient: SAXON CROSBY  Procedure(s) Performed: ESOPHAGOGASTRODUODENOSCOPY (EGD) (N/A ) COLONOSCOPY WITH PROPOFOL (N/A )  Patient location during evaluation: PACU Anesthesia Type: General Level of consciousness: awake and alert Pain management: pain level controlled Vital Signs Assessment: post-procedure vital signs reviewed and stable Respiratory status: spontaneous breathing, nonlabored ventilation, respiratory function stable and patient connected to nasal cannula oxygen Cardiovascular status: blood pressure returned to baseline and stable Postop Assessment: no apparent nausea or vomiting Anesthetic complications: no   No complications documented.   Last Vitals:  Vitals:   01/26/20 1437 01/26/20 1447  BP: 133/73 136/64  Pulse: 88 90  Resp: 18 16  Temp:    SpO2: 98% 96%    Last Pain:  Vitals:   01/27/20 0739  TempSrc:   PainSc: 0-No pain                 Molli Barrows

## 2020-01-31 ENCOUNTER — Other Ambulatory Visit: Payer: Self-pay

## 2020-01-31 ENCOUNTER — Inpatient Hospital Stay: Payer: BC Managed Care – PPO

## 2020-01-31 VITALS — BP 154/71 | HR 91 | Temp 99.7°F | Resp 19

## 2020-01-31 DIAGNOSIS — D509 Iron deficiency anemia, unspecified: Secondary | ICD-10-CM | POA: Diagnosis not present

## 2020-01-31 MED ORDER — SODIUM CHLORIDE 0.9 % IV SOLN: 200.0000 mg | Freq: Once | INTRAVENOUS | Status: DC

## 2020-01-31 MED ORDER — SODIUM CHLORIDE 0.9 % IV SOLN
Freq: Once | INTRAVENOUS | Status: AC
  Filled 2020-01-31: qty 250

## 2020-01-31 MED ORDER — IRON SUCROSE 20 MG/ML IV SOLN
200.0000 mg | Freq: Once | INTRAVENOUS | Status: AC
  Administered 2020-01-31: 200 mg via INTRAVENOUS
  Filled 2020-01-31: qty 10

## 2020-01-31 NOTE — Progress Notes (Signed)
Pt tolerated Venofer infusion well with no signs of complications. VSS. Pt stable for discharge.  ° °Meagan Bridges  °

## 2020-02-02 ENCOUNTER — Ambulatory Visit: Payer: BC Managed Care – PPO

## 2020-02-10 ENCOUNTER — Other Ambulatory Visit: Payer: Self-pay

## 2020-02-10 ENCOUNTER — Inpatient Hospital Stay: Payer: BC Managed Care – PPO

## 2020-02-10 VITALS — BP 119/66 | HR 105 | Temp 97.2°F | Resp 20

## 2020-02-10 DIAGNOSIS — D509 Iron deficiency anemia, unspecified: Secondary | ICD-10-CM

## 2020-02-10 MED ORDER — IRON SUCROSE 20 MG/ML IV SOLN
200.0000 mg | Freq: Once | INTRAVENOUS | Status: AC
  Administered 2020-02-10: 200 mg via INTRAVENOUS
  Filled 2020-02-10: qty 10

## 2020-02-10 MED ORDER — SODIUM CHLORIDE 0.9 % IV SOLN: 200.0000 mg | Freq: Once | INTRAVENOUS | Status: DC

## 2020-02-10 MED ORDER — SODIUM CHLORIDE 0.9 % IV SOLN
Freq: Once | INTRAVENOUS | Status: AC
  Filled 2020-02-10: qty 250

## 2020-02-10 NOTE — Progress Notes (Signed)
Stable at discharge 

## 2020-02-18 ENCOUNTER — Inpatient Hospital Stay: Payer: BC Managed Care – PPO | Attending: Oncology

## 2020-02-18 DIAGNOSIS — D509 Iron deficiency anemia, unspecified: Secondary | ICD-10-CM | POA: Insufficient documentation

## 2020-02-21 ENCOUNTER — Telehealth: Payer: Self-pay | Admitting: Oncology

## 2020-02-21 NOTE — Telephone Encounter (Signed)
Please call pt to r/s appts.

## 2020-02-21 NOTE — Telephone Encounter (Signed)
Pt needs to cancel/reschedule appointments for 03/24/20 requests call back.

## 2020-02-22 NOTE — Telephone Encounter (Signed)
FYI...  Called pt in reference to all appts sched She stated that all appt dates and times were ok and to keep as sched

## 2020-02-25 ENCOUNTER — Other Ambulatory Visit: Payer: Self-pay

## 2020-02-25 DIAGNOSIS — Z1231 Encounter for screening mammogram for malignant neoplasm of breast: Secondary | ICD-10-CM

## 2020-03-03 ENCOUNTER — Other Ambulatory Visit: Payer: Self-pay | Admitting: Gastroenterology

## 2020-03-03 ENCOUNTER — Other Ambulatory Visit (HOSPITAL_COMMUNITY): Payer: Self-pay | Admitting: Gastroenterology

## 2020-03-03 DIAGNOSIS — D500 Iron deficiency anemia secondary to blood loss (chronic): Secondary | ICD-10-CM

## 2020-03-06 ENCOUNTER — Inpatient Hospital Stay: Payer: BC Managed Care – PPO

## 2020-03-06 VITALS — BP 114/71 | HR 97 | Temp 97.0°F | Resp 19

## 2020-03-06 DIAGNOSIS — D509 Iron deficiency anemia, unspecified: Secondary | ICD-10-CM

## 2020-03-06 MED ORDER — SODIUM CHLORIDE 0.9 % IV SOLN
Freq: Once | INTRAVENOUS | Status: AC
  Filled 2020-03-06: qty 250

## 2020-03-06 MED ORDER — SODIUM CHLORIDE 0.9 % IV SOLN: 200.0000 mg | Freq: Once | INTRAVENOUS | Status: DC

## 2020-03-06 MED ORDER — IRON SUCROSE 20 MG/ML IV SOLN
200.0000 mg | Freq: Once | INTRAVENOUS | Status: AC
  Administered 2020-03-06: 200 mg via INTRAVENOUS
  Filled 2020-03-06: qty 10

## 2020-03-06 NOTE — Progress Notes (Signed)
Pt tolerated Venofer infusion well with no signs of complications. VSS. Pt stable for discharge.  ° °Carina Chaplin  °

## 2020-03-10 ENCOUNTER — Other Ambulatory Visit: Payer: Self-pay

## 2020-03-10 ENCOUNTER — Ambulatory Visit
Admission: RE | Admit: 2020-03-10 | Discharge: 2020-03-10 | Disposition: A | Discharge status: Home / Self Care | Payer: BC Managed Care – PPO | Source: Ambulatory Visit | Attending: Gastroenterology | Admitting: Gastroenterology

## 2020-03-10 DIAGNOSIS — D500 Iron deficiency anemia secondary to blood loss (chronic): Secondary | ICD-10-CM | POA: Diagnosis not present

## 2020-03-13 ENCOUNTER — Other Ambulatory Visit: Payer: BC Managed Care – PPO

## 2020-03-15 ENCOUNTER — Ambulatory Visit: Payer: BC Managed Care – PPO | Admitting: Oncology

## 2020-03-15 ENCOUNTER — Ambulatory Visit: Payer: BC Managed Care – PPO

## 2020-03-20 ENCOUNTER — Ambulatory Visit
Admission: RE | Admit: 2020-03-20 | Discharge: 2020-03-20 | Disposition: A | Discharge status: Home / Self Care | Payer: BC Managed Care – PPO | Source: Ambulatory Visit

## 2020-03-20 DIAGNOSIS — Z1231 Encounter for screening mammogram for malignant neoplasm of breast: Secondary | ICD-10-CM | POA: Diagnosis not present

## 2020-03-23 ENCOUNTER — Telehealth: Payer: Self-pay | Admitting: Oncology

## 2020-03-23 NOTE — Telephone Encounter (Addendum)
Can she r/s all for a later date when she will be available to see MD with possible iron 2 days after labs?  Or if she would rather we can do a virtual visit 1 day after labs with Iron appt 1 day after virtual visit.

## 2020-03-23 NOTE — Telephone Encounter (Signed)
Done. Pt decided to have all appts R/S labs on 2/17, MyChart visit on 2/18 and  +/- Venofer on 04/03/20 @ 130pm

## 2020-03-24 ENCOUNTER — Inpatient Hospital Stay: Payer: BC Managed Care – PPO

## 2020-03-27 ENCOUNTER — Inpatient Hospital Stay: Payer: BC Managed Care – PPO | Admitting: Oncology

## 2020-03-27 ENCOUNTER — Inpatient Hospital Stay: Payer: BC Managed Care – PPO

## 2020-03-30 ENCOUNTER — Inpatient Hospital Stay: Payer: BC Managed Care – PPO | Attending: Oncology

## 2020-03-30 DIAGNOSIS — R1310 Dysphagia, unspecified: Secondary | ICD-10-CM

## 2020-03-30 DIAGNOSIS — Z79899 Other long term (current) drug therapy: Secondary | ICD-10-CM | POA: Insufficient documentation

## 2020-03-30 DIAGNOSIS — Z720 Tobacco use: Secondary | ICD-10-CM

## 2020-03-30 DIAGNOSIS — Z8349 Family history of other endocrine, nutritional and metabolic diseases: Secondary | ICD-10-CM

## 2020-03-30 DIAGNOSIS — D509 Iron deficiency anemia, unspecified: Secondary | ICD-10-CM | POA: Diagnosis not present

## 2020-03-30 LAB — CBC WITH DIFFERENTIAL/PLATELET
Abs Immature Granulocytes: 0.05 10*3/uL (ref 0.00–0.07)
Basophils Absolute: 0.1 10*3/uL (ref 0.0–0.1)
Basophils Relative: 1 %
Eosinophils Absolute: 0.2 10*3/uL (ref 0.0–0.5)
Eosinophils Relative: 2 %
HCT: 40.4 % (ref 36.0–46.0)
Hemoglobin: 12.7 g/dL (ref 12.0–15.0)
Immature Granulocytes: 1 %
Lymphocytes Relative: 48 %
Lymphs Abs: 3.9 10*3/uL (ref 0.7–4.0)
MCH: 24.9 pg — ABNORMAL LOW (ref 26.0–34.0)
MCHC: 31.4 g/dL (ref 30.0–36.0)
MCV: 79.1 fL — ABNORMAL LOW (ref 80.0–100.0)
Monocytes Absolute: 0.4 10*3/uL (ref 0.1–1.0)
Monocytes Relative: 5 %
Neutro Abs: 3.5 10*3/uL (ref 1.7–7.7)
Neutrophils Relative %: 43 %
Platelets: 375 10*3/uL (ref 150–400)
RBC: 5.11 MIL/uL (ref 3.87–5.11)
RDW: 25.2 % — ABNORMAL HIGH (ref 11.5–15.5)
WBC: 8.2 10*3/uL (ref 4.0–10.5)
nRBC: 0 % (ref 0.0–0.2)

## 2020-03-30 LAB — IRON AND TIBC
Iron: 26 ug/dL — ABNORMAL LOW (ref 28–170)
Saturation Ratios: 6 % — ABNORMAL LOW (ref 10.4–31.8)
TIBC: 455 ug/dL — ABNORMAL HIGH (ref 250–450)
UIBC: 429 ug/dL

## 2020-03-30 LAB — VITAMIN B12: Vitamin B-12: 246 pg/mL (ref 180–914)

## 2020-03-30 LAB — FERRITIN: Ferritin: 12 ng/mL (ref 11–307)

## 2020-03-31 ENCOUNTER — Inpatient Hospital Stay: Payer: BC Managed Care – PPO | Admitting: Oncology

## 2020-04-03 ENCOUNTER — Inpatient Hospital Stay: Payer: BC Managed Care – PPO

## 2020-04-06 ENCOUNTER — Inpatient Hospital Stay (HOSPITAL_BASED_OUTPATIENT_CLINIC_OR_DEPARTMENT_OTHER): Payer: BC Managed Care – PPO | Admitting: Oncology

## 2020-04-06 ENCOUNTER — Encounter: Payer: Self-pay | Admitting: Oncology

## 2020-04-06 DIAGNOSIS — D509 Iron deficiency anemia, unspecified: Secondary | ICD-10-CM | POA: Diagnosis not present

## 2020-04-06 NOTE — Progress Notes (Signed)
Patient called/ pre- screened for virtual appoinment today with oncologist. Concerns of diarrhea and back pain

## 2020-04-06 NOTE — Progress Notes (Addendum)
HEMATOLOGY-ONCOLOGY TeleHEALTH VISIT PROGRESS NOTE  I connected with Meagan Bridges on 04/06/20  at 11:00 AM EST by video enabled telemedicine visit and verified that I am speaking with the correct person using two identifiers. I discussed the limitations, risks, security and privacy concerns of performing an evaluation and management service by telemedicine and the availability of in-person appointments. The patient expressed understanding and agreed to proceed.   Other persons participating in the visit and their role in the encounter:  None  Patient's location: Home  Provider's location: office Chief Complaint: Iron deficiency anemia    INTERVAL HISTORY Meagan Bridges is a 55 y.o. female who has above history reviewed by me today presents for follow up visit for management of iron deficiency  Problems and complaints are listed below:  Patient is status post Venofer treatments.  Tolerated well.  Fatigue has improved.  No new complaints  Review of Systems  Constitutional: Negative for appetite change, chills, fatigue and fever.  HENT:   Negative for hearing loss and voice change.   Eyes: Negative for eye problems.  Respiratory: Negative for chest tightness and cough.   Cardiovascular: Negative for chest pain.  Gastrointestinal: Negative for abdominal distention, abdominal pain and blood in stool.  Endocrine: Negative for hot flashes.  Genitourinary: Negative for difficulty urinating and frequency.   Musculoskeletal: Negative for arthralgias.  Skin: Negative for itching and rash.  Neurological: Negative for extremity weakness.  Hematological: Negative for adenopathy.  Psychiatric/Behavioral: Negative for confusion.    Past Medical History:  Diagnosis Date  . Anxiety   . Back pain   . Chronic pain disorder   . COPD (chronic obstructive pulmonary disease) ([place])   . Depression   . Diabetes mellitus without complication ([place])   . Dysrhythmia    tachycardia  . GERD  (gastroesophageal reflux disease)   . Heavy smoker    3-4ppd  . IBS (irritable bowel syndrome)   . Migraines    Past Surgical History:  Procedure Laterality Date  . BACK SURGERY    . CHOLECYSTECTOMY    . COLONOSCOPY WITH PROPOFOL N/A 03/11/2016   Procedure: COLONOSCOPY WITH PROPOFOL;  Surgeon: Manya Silvas, MD;  Location[facility] ENDOSCOPY;  Service: Endoscopy;  Laterality: N/A;  . COLONOSCOPY WITH PROPOFOL N/A 01/26/2020   Procedure: COLONOSCOPY WITH PROPOFOL;  Surgeon: Toledo, Benay Pike, MD;  Location: [facility] ENDOSCOPY;  Service: Gastroenterology;  Laterality: N/A;  . ESOPHAGOGASTRODUODENOSCOPY N/A 01/26/2020   Procedure: ESOPHAGOGASTRODUODENOSCOPY (EGD);  Surgeon: Toledo, Benay Pike, MD;  Location: [facility] ENDOSCOPY;  Service: Gastroenterology;  Laterality: N/A;  . LUMBAR [place] SURGERY  2002  . MICROLARYNGOSCOPY N/A 04/20/2018   Procedure: MICROLARYNGOSCOPY with BIOPSY;  Surgeon: Izora Gala, MD;  Location: [facility];  Service: ENT;  Laterality: N/A;  . UTERINE FIBROID SURGERY  2000    Family History  Problem Relation Age of Onset  . Diabetes Mother   . Kidney disease Mother   . Miscarriages / [place] Mother   . Diabetes Father   . Early death Father   . Hearing loss Father   . Heart disease Father   . Hyperlipidemia Father   . Hypertension Father   . Learning disabilities Father   . Breast cancer Neg Hx     Social History   Socioeconomic History  . Marital status: Married    Spouse name: Not on file  . Number of children: Not on file  . Years of education: Not on file  . Highest education level: Not  on file  Occupational History  . Not on file  Tobacco Use  . Smoking status: Current Every Day Smoker    Packs/day: 2.00    Years: 40.00    Pack years: 80.00    Types: Cigarettes  . Smokeless tobacco: Never Used  Vaping Use  . Vaping Use: Never used  Substance and Sexual Activity  . Alcohol use: No    Alcohol/week: 0.0 standard drinks  . Drug use:  No  . Sexual activity: Yes    Birth control/protection: Post-menopausal  Other Topics Concern  . Not on file  Social History Narrative  . Not on file   Social Determinants of Health   Financial Resource Strain: Not on file  Food Insecurity: Not on file  Transportation Needs: Not on file  Physical Activity: Not on file  Stress: Not on file  Social Connections: Not on file  Intimate Partner Violence: Not on file    Current Outpatient Medications on File Prior to Visit  Medication Sig Dispense Refill  . albuterol (PROVENTIL HFA;VENTOLIN HFA) 108 (90 Base) MCG/ACT inhaler Inhale into the lungs every 6 (six) hours as needed for wheezing or shortness of breath.    Marland Kitchen amitriptyline (ELAVIL) 25 MG tablet Take 100 mg by mouth at bedtime.  1  . apixaban (ELIQUIS) 5 MG TABS tablet Take 1 tablet by mouth every 12 (twelve) hours.    . Biotin 5000 MCG TABS Take 1 tablet by mouth daily.    Marland Kitchen buPROPion (WELLBUTRIN XL) 150 MG 24 hr tablet Take 150 mg by mouth daily.    . busPIRone (BUSPAR) 15 MG tablet Take 30 mg by mouth 3 (three) times daily.     . Cariprazine HCl 4.5 MG CAPS Take by mouth at bedtime.    . cholecalciferol (VITAMIN D3) 25 MCG (1000 UT) tablet Take 1,000 Units by mouth daily.    . diclofenac Sodium (VOLTAREN) 1 % GEL APPLY TWO TO FOUR GRAMS TO PAINFUL AREAS OF SKIN FOUR TIMES EACH DAY.    Marland Kitchen diltiazem (DILACOR XR) 180 MG 24 hr capsule Take 120 mg by mouth daily.     . Dulaglutide 0.75 MG/0.5ML SOPN Inject 1.5 mg into the skin once a week.     . DULoxetine (CYMBALTA) 60 MG capsule Take 60 mg by mouth 2 (two) times daily.    . empagliflozin (JARDIANCE) 25 MG TABS tablet Take 25 mg by mouth daily.    Marland Kitchen esomeprazole (NEXIUM) 20 MG packet Take 20 mg by mouth daily before breakfast.    . fexofenadine (ALLEGRA) 180 MG tablet Take 180 mg by mouth daily.    Marland Kitchen gabapentin (NEURONTIN) 300 MG capsule Take 400 mg by mouth 3 (three) times daily.    Marland Kitchen glipiZIDE (GLUCOTROL) 10 MG tablet Take 10 mg  by mouth 2 (two) times daily before a meal.    . HYDROcodone-acetaminophen (NORCO) 10-325 MG tablet Take 1 tablet by mouth every 6 (six) hours as needed for severe pain.    . magnesium gluconate (MAGONATE) 500 MG tablet Take by mouth.    . metFORMIN (GLUCOPHAGE) 500 MG tablet Take 1,000 mg by mouth 2 (two) times daily with a meal.     . methylphenidate (RITALIN) 20 MG tablet Take 20 mg by mouth daily.    Marland Kitchen omeprazole (PRILOSEC) 20 MG capsule Take 20 mg by mouth daily.    Marland Kitchen perphenazine (TRILAFON) 4 MG tablet Take 4 mg by mouth at bedtime.    Marland Kitchen tiZANidine (ZANAFLEX) 2 MG tablet  LIMIT 1 TABLET BY MOUTH 2   4 TIMES PER DAY IF TOLERATED. NO CYCLOBENZAPRINE NO FLEXERIL    . umeclidinium-vilanterol (ANORO ELLIPTA) 62.5-25 MCG/INH AEPB Inhale 1 puff into the lungs daily.    . cyclobenzaprine (FLEXERIL) 10 MG tablet Limit 1 tablet by mouth 2 - 3 times per day if tolerated (Patient not taking: No sig reported) 90 tablet 0  . fluticasone (FLONASE) 50 MCG/ACT nasal spray Place into both nostrils daily. (Patient not taking: No sig reported)     No current facility-administered medications on file prior to visit.    Allergies  Allergen Reactions  . Latex     Other reaction(s): Blister of skin AND/OR mucosa (finding), Pruritic rash  . Niacin     Other reaction(s): Other (qualifier value) Patient does not remember  . Tape     Other reaction(s): Blister of skin AND/OR mucosa (finding) CAN USE PAPER TAPE       Observations/Objective: Today's Vitals   04/06/20 1028  PainSc: 5    There is no height or weight on file to calculate BMI.  Physical Exam Neurological:     Mental Status: She is alert.     CBC    Component Value Date/Time   WBC 8.2 03/30/2020 1051   RBC 5.11 03/30/2020 1051   HGB 12.7 03/30/2020 1051   HGB 14.0 04/25/2013 2145   HCT 40.4 03/30/2020 1051   HCT 42.0 04/25/2013 2145   PLT 375 03/30/2020 1051   PLT 338 04/25/2013 2145   MCV 79.1 (L) 03/30/2020 1051   MCV 93  04/25/2013 2145   MCH 24.9 (L) 03/30/2020 1051   MCHC 31.4 03/30/2020 1051   RDW 25.2 (H) 03/30/2020 1051   RDW 14.0 04/25/2013 2145   LYMPHSABS 3.9 03/30/2020 1051   MONOABS 0.4 03/30/2020 1051   EOSABS 0.2 03/30/2020 1051   BASOSABS 0.1 03/30/2020 1051    CMP     Component Value Date/Time   NA 136 04/17/2018 1019   NA 142 04/25/2013 2145   K 4.7 04/17/2018 1019   K 3.9 04/25/2013 2145   CL 104 04/17/2018 1019   CL 111 (H) 04/25/2013 2145   CO2 22 04/17/2018 1019   CO2 26 04/25/2013 2145   GLUCOSE 264 (H) 04/17/2018 1019   GLUCOSE 106 (H) 04/25/2013 2145   BUN 9 04/17/2018 1019   BUN 4 (L) 04/25/2013 2145   CREATININE 1.08 (H) 04/17/2018 1019   CREATININE 0.91 04/25/2013 2145   CALCIUM 9.3 04/17/2018 1019   CALCIUM 8.5 04/25/2013 2145   PROT 7.3 01/07/2012 1139   ALBUMIN 3.6 01/07/2012 1139   AST 22 01/07/2012 1139   ALT 27 01/07/2012 1139   ALKPHOS 92 01/07/2012 1139   BILITOT 0.2 01/07/2012 1139   GFRNONAA 58 (L) 04/17/2018 1019   GFRNONAA >60 04/25/2013 2145   GFRAA >60 04/17/2018 1019   GFRAA >60 04/25/2013 2145     Assessment and Plan: 1. Iron deficiency anemia, unspecified iron deficiency anemia type     #Iron deficiency anemia. Labs are reviewed and discussed with patient.   Hemoglobin has improved to 12.7. Iron panel still consistent with iron deficiency. Recommend patient to proceed with IV Venofer treatment x2. Follow Up Instructions: Follow-up in 6 months.   I discussed the assessment and treatment plan with the patient. The patient was provided an opportunity to ask questions and all were answered. The patient agreed with the plan and demonstrated an understanding of the instructions.  The patient was advised to  call back or seek an in-person evaluation if the symptoms worsen or if the condition fails to improve as anticipated.    Earlie Server, MD 04/06/2020 7:40 PM

## 2020-04-07 ENCOUNTER — Inpatient Hospital Stay: Payer: BC Managed Care – PPO

## 2020-04-07 ENCOUNTER — Other Ambulatory Visit: Payer: Self-pay

## 2020-04-07 VITALS — BP 117/72 | HR 87 | Temp 99.0°F | Resp 18

## 2020-04-07 DIAGNOSIS — D509 Iron deficiency anemia, unspecified: Secondary | ICD-10-CM | POA: Diagnosis not present

## 2020-04-07 MED ORDER — SODIUM CHLORIDE 0.9 % IV SOLN: 200.0000 mg | Freq: Once | INTRAVENOUS | Status: DC

## 2020-04-07 MED ORDER — SODIUM CHLORIDE 0.9 % IV SOLN
Freq: Once | INTRAVENOUS | Status: AC
  Filled 2020-04-07: qty 250

## 2020-04-07 MED ORDER — IRON SUCROSE 20 MG/ML IV SOLN
200.0000 mg | Freq: Once | INTRAVENOUS | Status: AC
  Administered 2020-04-07: 200 mg via INTRAVENOUS
  Filled 2020-04-07: qty 10

## 2020-04-07 NOTE — Progress Notes (Signed)
Patient received prescribed treatment in clinic. Tolerated well. Patient stable at discharge. 

## 2020-04-20 ENCOUNTER — Inpatient Hospital Stay: Payer: BC Managed Care – PPO | Attending: Oncology

## 2020-09-29 ENCOUNTER — Other Ambulatory Visit: Payer: Self-pay

## 2020-09-29 DIAGNOSIS — D509 Iron deficiency anemia, unspecified: Secondary | ICD-10-CM

## 2020-10-02 ENCOUNTER — Inpatient Hospital Stay: Payer: BC Managed Care – PPO | Attending: Oncology

## 2020-10-02 DIAGNOSIS — D509 Iron deficiency anemia, unspecified: Secondary | ICD-10-CM | POA: Diagnosis not present

## 2020-10-02 LAB — CBC WITH DIFFERENTIAL/PLATELET
Abs Immature Granulocytes: 0.06 10*3/uL (ref 0.00–0.07)
Basophils Absolute: 0.1 10*3/uL (ref 0.0–0.1)
Basophils Relative: 1 %
Eosinophils Absolute: 0.3 10*3/uL (ref 0.0–0.5)
Eosinophils Relative: 2 %
HCT: 34.9 % — ABNORMAL LOW (ref 36.0–46.0)
Hemoglobin: 10.7 g/dL — ABNORMAL LOW (ref 12.0–15.0)
Immature Granulocytes: 1 %
Lymphocytes Relative: 29 %
Lymphs Abs: 3.5 10*3/uL (ref 0.7–4.0)
MCH: 24.8 pg — ABNORMAL LOW (ref 26.0–34.0)
MCHC: 30.7 g/dL (ref 30.0–36.0)
MCV: 81 fL (ref 80.0–100.0)
Monocytes Absolute: 0.5 10*3/uL (ref 0.1–1.0)
Monocytes Relative: 4 %
Neutro Abs: 7.7 10*3/uL (ref 1.7–7.7)
Neutrophils Relative %: 63 %
Platelets: 315 10*3/uL (ref 150–400)
RBC: 4.31 MIL/uL (ref 3.87–5.11)
RDW: 16.7 % — ABNORMAL HIGH (ref 11.5–15.5)
WBC: 12.1 10*3/uL — ABNORMAL HIGH (ref 4.0–10.5)
nRBC: 0 % (ref 0.0–0.2)

## 2020-10-02 LAB — IRON AND TIBC
Iron: 22 ug/dL — ABNORMAL LOW (ref 28–170)
Saturation Ratios: 5 % — ABNORMAL LOW (ref 10.4–31.8)
TIBC: 475 ug/dL — ABNORMAL HIGH (ref 250–450)
UIBC: 453 ug/dL

## 2020-10-02 LAB — FERRITIN: Ferritin: 4 ng/mL — ABNORMAL LOW (ref 11–307)

## 2020-10-04 ENCOUNTER — Encounter: Payer: Self-pay | Admitting: Oncology

## 2020-10-04 ENCOUNTER — Inpatient Hospital Stay (HOSPITAL_BASED_OUTPATIENT_CLINIC_OR_DEPARTMENT_OTHER): Payer: BC Managed Care – PPO | Admitting: Oncology

## 2020-10-04 ENCOUNTER — Inpatient Hospital Stay: Payer: BC Managed Care – PPO

## 2020-10-04 VITALS — BP 119/60 | HR 92 | Resp 18

## 2020-10-04 VITALS — BP 113/80 | HR 94 | Temp 97.9°F | Resp 18 | Wt 168.9 lb

## 2020-10-04 DIAGNOSIS — E538 Deficiency of other specified B group vitamins: Secondary | ICD-10-CM | POA: Diagnosis not present

## 2020-10-04 DIAGNOSIS — D509 Iron deficiency anemia, unspecified: Secondary | ICD-10-CM

## 2020-10-04 DIAGNOSIS — Z720 Tobacco use: Secondary | ICD-10-CM

## 2020-10-04 MED ORDER — SODIUM CHLORIDE 0.9 % IV SOLN: 200.0000 mg | Freq: Once | INTRAVENOUS | Status: DC

## 2020-10-04 MED ORDER — SODIUM CHLORIDE 0.9 % IV SOLN
Freq: Once | INTRAVENOUS | Status: AC
  Filled 2020-10-04: qty 250

## 2020-10-04 MED ORDER — IRON SUCROSE 20 MG/ML IV SOLN
200.0000 mg | Freq: Once | INTRAVENOUS | Status: AC
  Administered 2020-10-04: 200 mg via INTRAVENOUS
  Filled 2020-10-04: qty 10

## 2020-10-04 NOTE — Patient Instructions (Signed)
[facility]   Discharge Instructions: Thank you for choosing [facility] to provide your oncology and hematology care.  If you have a lab appointment with the Cancer Center, please go directly to the Cancer Center and check in at the registration area.  We strive to give you quality time with your provider. You may need to reschedule your appointment if you arrive late (15 or more minutes).  Arriving late affects you and other patients whose appointments are after yours.  Also, if you miss three or more appointments without notifying the office, you may be dismissed from the clinic at the provider's discretion.      For prescription refill requests, have your pharmacy contact our office and allow 72 hours for refills to be completed.    Today you received the following: Venofer.      BELOW ARE SYMPTOMS THAT SHOULD BE REPORTED IMMEDIATELY: *FEVER GREATER THAN 100.4 F (38 C) OR HIGHER *CHILLS OR SWEATING *NAUSEA AND VOMITING THAT IS NOT CONTROLLED WITH YOUR NAUSEA MEDICATION *UNUSUAL SHORTNESS OF BREATH *UNUSUAL BRUISING OR BLEEDING *URINARY PROBLEMS (pain or burning when urinating, or frequent urination) *BOWEL PROBLEMS (unusual diarrhea, constipation, pain near the anus) TENDERNESS IN MOUTH AND THROAT WITH OR WITHOUT PRESENCE OF ULCERS (sore throat, sores in mouth, or a toothache) UNUSUAL RASH, SWELLING OR PAIN  UNUSUAL VAGINAL DISCHARGE OR ITCHING   Items with * indicate a potential emergency and should be followed up as soon as possible or go to the Emergency Department if any problems should occur.  Should you have questions after your visit or need to cancel or reschedule your appointment, please contact [facility] [place]  [phone] and follow the prompts.  Office hours are 8:00 a.m. to 4:30 p.m. Monday - Friday. Please note that voicemails left after 4:00 p.m. may not be returned until the following  business day.  We are closed weekends and major holidays. You have access to a nurse at all times for urgent questions. Please call the main number to the clinic [phone] and follow the prompts.  For any non-urgent questions, you may also contact your provider using MyChart. We now offer e-Visits for anyone 18 and older to request care online for non-urgent symptoms. For details visit [email or web address].   Also download the MyChart app! Go to the app store, search "MyChart", open the app, select [facility], and log in with your MyChart username and password.  Due to Covid, a mask is required upon entering the hospital/clinic. If you do not have a mask, one will be given to you upon arrival. For doctor visits, patients may have 1 support person aged 18 or older with them. For treatment visits, patients cannot have anyone with them due to current Covid guidelines and our immunocompromised population.  

## 2020-10-04 NOTE — Progress Notes (Signed)
Pt here for follow up. No new concerns voiced.   

## 2020-10-04 NOTE — Progress Notes (Addendum)
Hematology/Oncology Consult note [facility] [phone] [phone]   Patient Care Team: Baxter Hire, MD as PCP - General (Internal Medicine) Earlie Server, MD as Consulting Physician (Hematology and Oncology)  REFERRING PROVIDER: Baxter Hire, MD CHIEF COMPLAINTS/REASON FOR VISIT:  Evaluation of iron deficiency anemia  HISTORY OF PRESENTING ILLNESS:  Meagan Bridges is a  55 y.o.  female with PMH listed below was seen in consultation at the request of Baxter Hire, MD   for evaluation of iron deficiency anemia.   Reviewed patient's recent labs that are done at [facility] clinic. 01/19/2020 labs revealed anemia with hemoglobin of 8.1, MCV 72, hematocrit 28. Normal WBC and platelet count.   Reviewed patient's previous labs ordered by primary care physician's office, anemia is new onset, approximately from November 2021. Patient had a normal hemoglobin of 13.9 on 06/21/2019. She denies any black or bloody stool. She reports some dysphagia symptoms. History of GERD. She has been seen by another clinic gastroenterology and has scheduled for colonoscopy and  EGD.  Associated signs and symptoms: Patient reports fatigue and lack of energy.. Mild SOB with exertion.  Denies weight loss, easy bruising, hematochezia, hemoptysis, hematuria. Context:  History of iron deficiency: Denies Rectal bleeding: Denies Menstrual bleeding/ Vaginal bleeding : He has had endometrial ablation  Hematemesis or hemoptysis : denies Blood in urine : denies  Patient takes Eliquis for paroxysmal atrial fibrillation managed by Dr. Clayborn Bigness Patient smokes daily. Denies any alcohol use.   INTERVAL HISTORY Meagan Bridges is a 55 y.o. female who has above history reviewed by me today presents for follow up visit for management of iron deficiency anemia. Problems and complaints are listed below: Patient was accompanied by her husband Reports feeling fatigued and tired.   Denies any black or bloody stool.  Denies any abdominal pain.  Patient has had EGD/colonoscopy/capsule study at [facility] and had no obvious bleeding source with identified   Review of Systems  Constitutional:  Positive for fatigue. Negative for appetite change, chills and fever.  HENT:   Negative for hearing loss and voice change.   Eyes:  Negative for eye problems.  Respiratory:  Negative for chest tightness and cough.   Cardiovascular:  Negative for chest pain.  Gastrointestinal:  Negative for abdominal distention, abdominal pain and blood in stool.  Endocrine: Negative for hot flashes.  Genitourinary:  Negative for difficulty urinating and frequency.   Musculoskeletal:  Negative for arthralgias.  Skin:  Negative for itching and rash.  Neurological:  Negative for extremity weakness.  Hematological:  Negative for adenopathy.  Psychiatric/Behavioral:  Negative for confusion.    MEDICAL HISTORY:  Past Medical History:  Diagnosis Date   Anxiety    Back pain    Chronic pain disorder    COPD (chronic obstructive pulmonary disease) (HCC)    Depression    Diabetes mellitus without complication (HCC)    Dysrhythmia    tachycardia   GERD (gastroesophageal reflux disease)    Heavy smoker    3-4ppd   IBS (irritable bowel syndrome)    Migraines     SURGICAL HISTORY: Past Surgical History:  Procedure Laterality Date   BACK SURGERY     CHOLECYSTECTOMY     COLONOSCOPY WITH PROPOFOL N/A 03/11/2016   Procedure: COLONOSCOPY WITH PROPOFOL;  Surgeon: Manya Silvas, MD;  Location: [facility];  Service: Endoscopy;  Laterality: N/A;   COLONOSCOPY WITH PROPOFOL N/A 01/26/2020   Procedure: COLONOSCOPY WITH PROPOFOL;  Surgeon: [place],  Benay Pike, MD;  Location: [facility] END[facility]COPY;  Service: Gastroenterology;  Laterality: N/A;   ESOPHAGOGASTRODUODENOSCOPY N/A 01/26/2020   Procedure: ESOPHAGOGASTRODUODENOSCOPY (EGD);  Surgeon: [place], Benay Pike, MD;  Location: [facility] ENDOSCOPY;  Service:  Gastroenterology;  Laterality: N/A;   LUMBAR DISC SURGERY  2002   MICROLARYNGOSCOPY N/A 04/20/2018   Procedure: MICROLARYNGOSCOPY with BIOPSY;  Surgeon: Izora Gala, MD;  Location: [place];  Service: ENT;  Laterality: N/A;   UTERINE FIBROID SURGERY  2000    SOCIAL HISTORY: Social History   Socioeconomic History   Marital status: Married    Spouse name: Not on file   Number of children: Not on file   Years of education: Not on file   Highest education level: Not on file  Occupational History   Not on file  Tobacco Use   Smoking status: Every Day    Packs/day: 2.00    Years: 40.00    Pack years: 80.00    Types: Cigarettes   Smokeless tobacco: Never  Vaping Use   Vaping Use: Never used  Substance and Sexual Activity   Alcohol use: No    Alcohol/week: 0.0 standard drinks   Drug use: No   Sexual activity: Yes    Birth control/protection: Post-menopausal  Other Topics Concern   Not on file  Social History Narrative   Not on file   Social Determinants of Health   Financial Resource Strain: Not on file  Food Insecurity: Not on file  Transportation Needs: Not on file  Physical Activity: Not on file  Stress: Not on file  Social Connections: Not on file  Intimate Partner Violence: Not on file    FAMILY HISTORY: Family History  Problem Relation Age of Onset   Diabetes Mother    Kidney disease Mother    Miscarriages / [place] Mother    Diabetes Father    Early death Father    Hearing loss Father    Heart disease Father    Hyperlipidemia Father    Hypertension Father    Learning disabilities Father    Breast cancer Neg Hx     ALLERGIES:  is allergic to latex, niacin, and tape.  MEDICATIONS:  Current Outpatient Medications  Medication Sig Dispense Refill   albuterol (PROVENTIL HFA;VENTOLIN HFA) 108 (90 Base) MCG/ACT inhaler Inhale into the lungs every 6 (six) hours as needed for wheezing or shortness of breath.     amitriptyline (ELAVIL) 25  MG tablet Take 100 mg by mouth at bedtime.  1   apixaban (ELIQUIS) 5 MG TABS tablet Take 1 tablet by mouth every 12 (twelve) hours.     Biotin 5000 MCG TABS Take 1 tablet by mouth daily.     buPROPion (WELLBUTRIN XL) 150 MG 24 hr tablet Take 150 mg by mouth daily.     busPIRone (BUSPAR) 15 MG tablet Take 30 mg by mouth 3 (three) times daily.      Cariprazine HCl 4.5 MG CAPS Take by mouth at bedtime.     cholecalciferol (VITAMIN D3) 25 MCG (1000 UT) tablet Take 1,000 Units by mouth daily.     cyclobenzaprine (FLEXERIL) 10 MG tablet Limit 1 tablet by mouth 2 - 3 times per day if tolerated 90 tablet 0   diclofenac Sodium (VOLTAREN) 1 % GEL APPLY TWO TO FOUR GRAMS TO PAINFUL AREAS OF SKIN FOUR TIMES EACH DAY.     diltiazem (DILACOR XR) 180 MG 24 hr capsule Take 120 mg by mouth daily.      Dulaglutide  0.75 MG/0.5ML SOPN Inject 1.5 mg into the skin once a week.      DULoxetine (CYMBALTA) 60 MG capsule Take 60 mg by mouth 2 (two) times daily.     empagliflozin (JARDIANCE) 25 MG TABS tablet Take 25 mg by mouth daily.     esomeprazole (NEXIUM) 20 MG packet Take 20 mg by mouth daily before breakfast.     fexofenadine (ALLEGRA) 180 MG tablet Take 180 mg by mouth daily.     gabapentin (NEURONTIN) 100 MG capsule Take 100 mg by mouth 3 (three) times daily. 100 mg in the morning and at lunch. 200 mg at bedtime.     glipiZIDE (GLUCOTROL) 10 MG tablet Take 10 mg by mouth 2 (two) times daily before a meal.     HYDROcodone-acetaminophen (NORCO) 10-325 MG tablet Take 1 tablet by mouth every 6 (six) hours as needed for severe pain.     magnesium gluconate (MAGONATE) 500 MG tablet Take by mouth.     metFORMIN (GLUCOPHAGE) 500 MG tablet Take 1,000 mg by mouth 2 (two) times daily with a meal.      methylphenidate (RITALIN) 20 MG tablet Take 20 mg by mouth daily.     omeprazole (PRILOSEC) 20 MG capsule Take 20 mg by mouth daily.     perphenazine (TRILAFON) 4 MG tablet Take 4 mg by mouth at bedtime.     tiZANidine  (ZANAFLEX) 2 MG tablet LIMIT 1 TABLET BY MOUTH 2   4 TIMES PER DAY IF TOLERATED. NO CYCLOBENZAPRINE NO FLEXERIL     umeclidinium-vilanterol (ANORO ELLIPTA) 62.5-25 MCG/INH AEPB Inhale 1 puff into the lungs daily.     fluticasone (FLONASE) 50 MCG/ACT nasal spray Place into both nostrils daily. (Patient not taking: Reported on 10/04/2020)     No current facility-administered medications for this visit.     PHYSICAL EXAMINATION: ECOG PERFORMANCE STATUS: 1 - Symptomatic but completely ambulatory Vitals:   10/04/20 1311  BP: 113/80  Pulse: 94  Resp: 18  Temp: 97.9 F (36.6 C)   Filed Weights   10/04/20 1311  Weight: 168 lb 14.4 oz (76.6 kg)    Physical Exam Constitutional:      General: She is not in acute distress. HENT:     Head: Normocephalic and atraumatic.  Eyes:     General: No scleral icterus. Cardiovascular:     Rate and Rhythm: Normal rate and regular rhythm.     Heart sounds: Normal heart sounds.  Pulmonary:     Effort: Pulmonary effort is normal. No respiratory distress.     Breath sounds: No wheezing.  Abdominal:     General: Bowel sounds are normal. There is no distension.     Palpations: Abdomen is soft.  Musculoskeletal:        General: No deformity. Normal range of motion.     Cervical back: Normal range of motion and neck supple.  Skin:    General: Skin is warm and dry.     Findings: No erythema or rash.  Neurological:     Mental Status: She is alert and oriented to person, place, and time. Mental status is at baseline.     Cranial Nerves: No cranial nerve deficit.     Coordination: Coordination normal.  Psychiatric:        Mood and Affect: Mood normal.          LABORATORY DATA:  I have reviewed the data as listed Lab Results  Component Value Date   WBC 12.1 (H) 10/02/2020  HGB 10.7 (L) 10/02/2020   HCT 34.9 (L) 10/02/2020   MCV 81.0 10/02/2020   PLT 315 10/02/2020   No results for input(s): NA, K, CL, CO2, GLUCOSE, BUN, CREATININE,  CALCIUM, GFRNONAA, GFRAA, PROT, ALBUMIN, AST, ALT, ALKPHOS, BILITOT, BILIDIR, IBILI in the last 8760 hours. Iron/TIBC/Ferritin/ %Sat    Component Value Date/Time   IRON 22 (L) 10/02/2020 1447   TIBC 475 (H) 10/02/2020 1447   FERRITIN 4 (L) 10/02/2020 1447   IRONPCTSAT 5 (L) 10/02/2020 1447     RADIOGRAPHIC STUDIES: I have personally reviewed the radiological images as listed and agreed with the findings in the report. No results found.      ASSESSMENT & PLAN:  1. Iron deficiency anemia, unspecified iron deficiency anemia type   2. Tobacco use   3. B12 deficiency    Labs reviewed and discussed with patient. Consistent with iron deficiency anemia Patient has previously tolerated IV Venofer treatments. Recommend IV Venofer 200 mg weekly x4 doses per Recommend her to follow-up with gastroenterology for further evaluation of I discussed with [facility] clinic GI team and patient will being seen for follow-up. Check UA  Tobacco use, smoke cessation was discussed with patient. 80-pack-year smoking history, recommend patient to have lung cancer screening done.  Refer to lung cancer screening program.  History of vitamin B12.  We will check B12 the next visit.  Orders Placed This Encounter  Procedures   Urinalysis, Complete w Microscopic    Standing Status:   Future    Standing Expiration Date:   10/04/2021    All questions were answered. The patient knows to call the clinic with any problems questions or concerns.  Cc Baxter Hire, MD  Return of visit:  10 weeks Thank you for this kind referral and the opportunity to participate in the care of this patient. A copy of today's note is routed to referring provider   Earlie Server, MD, PhD Hematology Oncology [place] at [facility] 10/04/2020

## 2020-10-04 NOTE — Addendum Note (Signed)
Addended by: Earlie Server on: 10/04/2020 08:00 PM   Modules accepted: Orders

## 2020-10-11 ENCOUNTER — Ambulatory Visit: Payer: BC Managed Care – PPO

## 2020-10-12 ENCOUNTER — Inpatient Hospital Stay: Payer: BC Managed Care – PPO | Attending: Oncology

## 2020-10-12 VITALS — BP 119/73 | HR 93 | Temp 97.7°F | Resp 16

## 2020-10-12 DIAGNOSIS — D509 Iron deficiency anemia, unspecified: Secondary | ICD-10-CM | POA: Diagnosis not present

## 2020-10-12 MED ORDER — SODIUM CHLORIDE 0.9 % IV SOLN
Freq: Once | INTRAVENOUS | Status: AC
  Filled 2020-10-12: qty 250

## 2020-10-12 MED ORDER — SODIUM CHLORIDE 0.9 % IV SOLN: 200.0000 mg | Freq: Once | INTRAVENOUS | Status: DC

## 2020-10-12 MED ORDER — IRON SUCROSE 20 MG/ML IV SOLN
200.0000 mg | Freq: Once | INTRAVENOUS | Status: AC
  Administered 2020-10-12: 200 mg via INTRAVENOUS
  Filled 2020-10-12: qty 10

## 2020-10-12 NOTE — Patient Instructions (Signed)

## 2020-10-18 ENCOUNTER — Ambulatory Visit: Payer: BC Managed Care – PPO

## 2020-10-19 ENCOUNTER — Inpatient Hospital Stay: Payer: BC Managed Care – PPO

## 2020-10-25 ENCOUNTER — Ambulatory Visit: Payer: BC Managed Care – PPO

## 2020-10-26 ENCOUNTER — Other Ambulatory Visit: Payer: Self-pay

## 2020-10-26 ENCOUNTER — Inpatient Hospital Stay: Payer: BC Managed Care – PPO

## 2020-10-26 VITALS — BP 103/63 | HR 90 | Temp 97.0°F | Resp 18

## 2020-10-26 DIAGNOSIS — D509 Iron deficiency anemia, unspecified: Secondary | ICD-10-CM | POA: Diagnosis not present

## 2020-10-26 MED ORDER — SODIUM CHLORIDE 0.9 % IV SOLN: 200.0000 mg | Freq: Once | INTRAVENOUS | Status: DC

## 2020-10-26 MED ORDER — IRON SUCROSE 20 MG/ML IV SOLN
200.0000 mg | Freq: Once | INTRAVENOUS | Status: AC
  Administered 2020-10-26: 200 mg via INTRAVENOUS
  Filled 2020-10-26: qty 10

## 2020-10-26 MED ORDER — SODIUM CHLORIDE 0.9 % IV SOLN
Freq: Once | INTRAVENOUS | Status: AC
  Filled 2020-10-26: qty 250

## 2020-10-26 NOTE — Patient Instructions (Signed)

## 2020-11-02 ENCOUNTER — Inpatient Hospital Stay: Payer: BC Managed Care – PPO

## 2020-11-02 VITALS — BP 110/61 | HR 91 | Temp 99.0°F | Resp 20

## 2020-11-02 DIAGNOSIS — D509 Iron deficiency anemia, unspecified: Secondary | ICD-10-CM

## 2020-11-02 MED ORDER — SODIUM CHLORIDE 0.9 % IV SOLN
INTRAVENOUS | Status: DC
  Filled 2020-11-02: qty 250

## 2020-11-02 MED ORDER — IRON SUCROSE 20 MG/ML IV SOLN
200.0000 mg | Freq: Once | INTRAVENOUS | Status: AC
  Administered 2020-11-02: 200 mg via INTRAVENOUS
  Filled 2020-11-02: qty 10

## 2020-11-02 MED ORDER — SODIUM CHLORIDE 0.9 % IV SOLN: 200.0000 mg | Freq: Once | INTRAVENOUS | Status: DC

## 2020-12-11 ENCOUNTER — Inpatient Hospital Stay: Payer: BC Managed Care – PPO | Attending: Oncology

## 2020-12-11 ENCOUNTER — Other Ambulatory Visit: Payer: Self-pay

## 2020-12-11 DIAGNOSIS — D509 Iron deficiency anemia, unspecified: Secondary | ICD-10-CM | POA: Insufficient documentation

## 2020-12-11 DIAGNOSIS — Z720 Tobacco use: Secondary | ICD-10-CM

## 2020-12-11 DIAGNOSIS — E538 Deficiency of other specified B group vitamins: Secondary | ICD-10-CM

## 2020-12-11 LAB — URINALYSIS, COMPLETE (UACMP) WITH MICROSCOPIC
Bilirubin Urine: NEGATIVE
Glucose, UA: >=500 mg/dL — AB
Hgb urine dipstick: NEGATIVE
Ketones, ur: NEGATIVE mg/dL
Nitrite: NEGATIVE
Protein, ur: NEGATIVE mg/dL
Specific Gravity, Urine: 1.022 (ref 1.005–1.030)
pH: 5 (ref 5.0–8.0)

## 2020-12-11 LAB — COMPREHENSIVE METABOLIC PANEL
ALT: 13 U/L (ref 0–44)
AST: 14 U/L — ABNORMAL LOW (ref 15–41)
Albumin: 3.6 g/dL (ref 3.5–5.0)
Alkaline Phosphatase: 90 U/L (ref 38–126)
Anion gap: 7 (ref 5–15)
BUN: 10 mg/dL (ref 6–20)
CO2: 25 mmol/L (ref 22–32)
Calcium: 8.3 mg/dL — ABNORMAL LOW (ref 8.9–10.3)
Chloride: 105 mmol/L (ref 98–111)
Creatinine, Ser: 1.15 mg/dL — ABNORMAL HIGH (ref 0.44–1.00)
GFR, Estimated: 56 mL/min — ABNORMAL LOW (ref >60)
Glucose, Bld: 148 mg/dL — ABNORMAL HIGH (ref 70–99)
Potassium: 3.8 mmol/L (ref 3.5–5.1)
Sodium: 137 mmol/L (ref 135–145)
Total Bilirubin: 0.4 mg/dL (ref 0.3–1.2)
Total Protein: 6.6 g/dL (ref 6.5–8.1)

## 2020-12-11 LAB — CBC WITH DIFFERENTIAL/PLATELET
Abs Immature Granulocytes: 0.04 10*3/uL (ref 0.00–0.07)
Basophils Absolute: 0.1 10*3/uL (ref 0.0–0.1)
Basophils Relative: 1 %
Eosinophils Absolute: 0.2 10*3/uL (ref 0.0–0.5)
Eosinophils Relative: 2 %
HCT: 42.3 % (ref 36.0–46.0)
Hemoglobin: 13.6 g/dL (ref 12.0–15.0)
Immature Granulocytes: 0 %
Lymphocytes Relative: 35 %
Lymphs Abs: 3.7 10*3/uL (ref 0.7–4.0)
MCH: 28.5 pg (ref 26.0–34.0)
MCHC: 32.2 g/dL (ref 30.0–36.0)
MCV: 88.7 fL (ref 80.0–100.0)
Monocytes Absolute: 0.5 10*3/uL (ref 0.1–1.0)
Monocytes Relative: 5 %
Neutro Abs: 6 10*3/uL (ref 1.7–7.7)
Neutrophils Relative %: 57 %
Platelets: 302 10*3/uL (ref 150–400)
RBC: 4.77 MIL/uL (ref 3.87–5.11)
RDW: 20.6 % — ABNORMAL HIGH (ref 11.5–15.5)
WBC: 10.5 10*3/uL (ref 4.0–10.5)
nRBC: 0 % (ref 0.0–0.2)

## 2020-12-11 LAB — IRON AND TIBC
Iron: 25 ug/dL — ABNORMAL LOW (ref 28–170)
Saturation Ratios: 7 % — ABNORMAL LOW (ref 10.4–31.8)
TIBC: 386 ug/dL (ref 250–450)
UIBC: 361 ug/dL

## 2020-12-11 LAB — FERRITIN: Ferritin: 8 ng/mL — ABNORMAL LOW (ref 11–307)

## 2020-12-12 ENCOUNTER — Inpatient Hospital Stay: Payer: BC Managed Care – PPO

## 2020-12-12 ENCOUNTER — Inpatient Hospital Stay: Payer: BC Managed Care – PPO | Attending: Oncology | Admitting: Oncology

## 2020-12-12 VITALS — BP 133/67 | HR 86 | Temp 97.2°F | Resp 18

## 2020-12-12 DIAGNOSIS — Z720 Tobacco use: Secondary | ICD-10-CM

## 2020-12-12 DIAGNOSIS — E538 Deficiency of other specified B group vitamins: Secondary | ICD-10-CM

## 2020-12-12 DIAGNOSIS — D509 Iron deficiency anemia, unspecified: Secondary | ICD-10-CM | POA: Diagnosis not present

## 2020-12-12 MED ORDER — SODIUM CHLORIDE 0.9 % IV SOLN
INTRAVENOUS | Status: DC
  Filled 2020-12-12: qty 250

## 2020-12-12 MED ORDER — SODIUM CHLORIDE 0.9 % IV SOLN: 200.0000 mg | Freq: Once | INTRAVENOUS | Status: DC

## 2020-12-12 MED ORDER — IRON SUCROSE 20 MG/ML IV SOLN
200.0000 mg | Freq: Once | INTRAVENOUS | Status: AC
  Administered 2020-12-12: 200 mg via INTRAVENOUS
  Filled 2020-12-12: qty 10

## 2020-12-12 NOTE — Progress Notes (Signed)
Hematology/Oncology Consult note [facility] [phone] [phone]   Patient Care Team: Baxter Hire, MD as PCP - General (Internal Medicine) Earlie Server, MD as Consulting Physician (Hematology and Oncology)  REFERRING PROVIDER: Baxter Hire, MD CHIEF COMPLAINTS/REASON FOR VISIT:  Evaluation of iron deficiency anemia  HISTORY OF PRESENTING ILLNESS:  Meagan Bridges is a  55 y.o.  female with PMH listed below was seen in consultation at the request of Baxter Hire, MD   for evaluation of iron deficiency anemia.   Reviewed patient's recent labs that are done at [facility] clinic. 01/19/2020 labs revealed anemia with hemoglobin of 8.1, MCV 72, hematocrit 28. Normal WBC and platelet count.   Reviewed patient's previous labs ordered by primary care physician's office, anemia is new onset, approximately from November 2021. Patient had a normal hemoglobin of 13.9 on 06/21/2019. She denies any black or bloody stool. She reports some dysphagia symptoms. History of GERD. She has been seen by another clinic gastroenterology and has scheduled for colonoscopy and  EGD.  Associated signs and symptoms: Patient reports fatigue and lack of energy.. Mild SOB with exertion.  Denies weight loss, easy bruising, hematochezia, hemoptysis, hematuria. Context:  History of iron deficiency: Denies Rectal bleeding: Denies Menstrual bleeding/ Vaginal bleeding : He has had endometrial ablation  Hematemesis or hemoptysis : denies Blood in urine : denies  Patient takes Eliquis for paroxysmal atrial fibrillation managed by Dr. Clayborn Bigness Patient smokes daily. Denies any alcohol use.  INTERVAL HISTORY Meagan Bridges is a 55 year old female with above history who presents for management of iron deficiency anemia.  She last received IV iron from 10/04/2020 - 11/02/2020.  She presents back for repeat labs and possible IV iron.  Denies any black or bloody stool. Denies any  recent hospitalizations.  States she feels improved post IV iron.  Reports increased energy levels.  Has stable chronic fatigue.  Has not tried oral iron tablets.  She was told that it would not help her.  Review of Systems  Constitutional:  Positive for fatigue. Negative for appetite change, chills and fever.  HENT:   Negative for hearing loss and voice change.   Eyes:  Negative for eye problems.  Respiratory:  Negative for chest tightness and cough.   Cardiovascular:  Negative for chest pain.  Gastrointestinal:  Negative for abdominal distention, abdominal pain and blood in stool.  Endocrine: Negative for hot flashes.  Genitourinary:  Negative for difficulty urinating and frequency.   Musculoskeletal:  Negative for arthralgias.  Skin:  Negative for itching and rash.  Neurological:  Negative for extremity weakness.  Hematological:  Negative for adenopathy.  Psychiatric/Behavioral:  Negative for confusion.    MEDICAL HISTORY:  Past Medical History:  Diagnosis Date   Anxiety    Back pain    Chronic pain disorder    COPD (chronic obstructive pulmonary disease) (HCC)    Depression    Diabetes mellitus without complication (HCC)    Dysrhythmia    tachycardia   GERD (gastroesophageal reflux disease)    Heavy smoker    3-4ppd   IBS (irritable bowel syndrome)    Migraines     SURGICAL HISTORY: Past Surgical History:  Procedure Laterality Date   BACK SURGERY     CHOLECYSTECTOMY     COLONOSCOPY WITH PROPOFOL N/A 03/11/2016   Procedure: COLONOSCOPY WITH PROPOFOL;  Surgeon: Manya Silvas, MD;  Location: [place];  Service: Endoscopy;  Laterality: N/A;   COLONOSCOPY WITH PROPOFOL N/A 01/26/2020  Procedure: COLONOSCOPY WITH PROPOFOL;  Surgeon: [place], Benay Pike, MD;  Location: [facility] END[facility]COPY;  Service: Gastroenterology;  Laterality: N/A;   ESOPHAGOGASTRODUODENOSCOPY N/A 01/26/2020   Procedure: ESOPHAGOGASTRODUODENOSCOPY (EGD);  Surgeon: [place], Benay Pike, MD;  Location: [facility]  ENDOSCOPY;  Service: Gastroenterology;  Laterality: N/A;   LUMBAR DISC SURGERY  2002   MICROLARYNGOSCOPY N/A 04/20/2018   Procedure: MICROLARYNGOSCOPY with BIOPSY;  Surgeon: Izora Gala, MD;  Location: [place];  Service: ENT;  Laterality: N/A;   UTERINE FIBROID SURGERY  2000    SOCIAL HISTORY: Social History   Socioeconomic History   Marital status: Married    Spouse name: Not on file   Number of children: Not on file   Years of education: Not on file   Highest education level: Not on file  Occupational History   Not on file  Tobacco Use   Smoking status: Every Day    Packs/day: 2.00    Years: 40.00    Pack years: 80.00    Types: Cigarettes   Smokeless tobacco: Never  Vaping Use   Vaping Use: Never used  Substance and Sexual Activity   Alcohol use: No    Alcohol/week: 0.0 standard drinks   Drug use: No   Sexual activity: Yes    Birth control/protection: Post-menopausal  Other Topics Concern   Not on file  Social History Narrative   Not on file   Social Determinants of Health   Financial Resource Strain: Not on file  Food Insecurity: Not on file  Transportation Needs: Not on file  Physical Activity: Not on file  Stress: Not on file  Social Connections: Not on file  Intimate Partner Violence: Not on file    FAMILY HISTORY: Family History  Problem Relation Age of Onset   Diabetes Mother    Kidney disease Mother    Miscarriages / [place] Mother    Diabetes Father    Early death Father    Hearing loss Father    Heart disease Father    Hyperlipidemia Father    Hypertension Father    Learning disabilities Father    Breast cancer Neg Hx     ALLERGIES:  is allergic to latex, niacin, and tape.  MEDICATIONS:  Current Outpatient Medications  Medication Sig Dispense Refill   albuterol (PROVENTIL HFA;VENTOLIN HFA) 108 (90 Base) MCG/ACT inhaler Inhale into the lungs every 6 (six) hours as needed for wheezing or shortness of breath.      amitriptyline (ELAVIL) 25 MG tablet Take 100 mg by mouth at bedtime.  1   apixaban (ELIQUIS) 5 MG TABS tablet Take 1 tablet by mouth every 12 (twelve) hours.     Biotin 5000 MCG TABS Take 1 tablet by mouth daily.     buPROPion (WELLBUTRIN XL) 150 MG 24 hr tablet Take 150 mg by mouth daily.     busPIRone (BUSPAR) 15 MG tablet Take 30 mg by mouth 3 (three) times daily.      Cariprazine HCl 4.5 MG CAPS Take by mouth at bedtime.     cholecalciferol (VITAMIN D3) 25 MCG (1000 UT) tablet Take 1,000 Units by mouth daily.     cyclobenzaprine (FLEXERIL) 10 MG tablet Limit 1 tablet by mouth 2 - 3 times per day if tolerated 90 tablet 0   diclofenac Sodium (VOLTAREN) 1 % GEL APPLY TWO TO FOUR GRAMS TO PAINFUL AREAS OF SKIN FOUR TIMES EACH DAY.     diltiazem (DILACOR XR) 180 MG 24 hr capsule Take 120 mg by mouth  daily.      Dulaglutide 0.75 MG/0.5ML SOPN Inject 1.5 mg into the skin once a week.      DULoxetine (CYMBALTA) 60 MG capsule Take 60 mg by mouth 2 (two) times daily.     empagliflozin (JARDIANCE) 25 MG TABS tablet Take 25 mg by mouth daily.     esomeprazole (NEXIUM) 20 MG packet Take 20 mg by mouth daily before breakfast.     fexofenadine (ALLEGRA) 180 MG tablet Take 180 mg by mouth daily.     fluticasone (FLONASE) 50 MCG/ACT nasal spray Place into both nostrils daily. (Patient not taking: Reported on 10/04/2020)     gabapentin (NEURONTIN) 100 MG capsule Take 100 mg by mouth 3 (three) times daily. 100 mg in the morning and at lunch. 200 mg at bedtime.     glipiZIDE (GLUCOTROL) 10 MG tablet Take 10 mg by mouth 2 (two) times daily before a meal.     HYDROcodone-acetaminophen (NORCO) 10-325 MG tablet Take 1 tablet by mouth every 6 (six) hours as needed for severe pain.     magnesium gluconate (MAGONATE) 500 MG tablet Take by mouth.     metFORMIN (GLUCOPHAGE) 500 MG tablet Take 1,000 mg by mouth 2 (two) times daily with a meal.      methylphenidate (RITALIN) 20 MG tablet Take 20 mg by mouth daily.      omeprazole (PRILOSEC) 20 MG capsule Take 20 mg by mouth daily.     perphenazine (TRILAFON) 4 MG tablet Take 4 mg by mouth at bedtime.     tiZANidine (ZANAFLEX) 2 MG tablet LIMIT 1 TABLET BY MOUTH 2   4 TIMES PER DAY IF TOLERATED. NO CYCLOBENZAPRINE NO FLEXERIL     umeclidinium-vilanterol (ANORO ELLIPTA) 62.5-25 MCG/INH AEPB Inhale 1 puff into the lungs daily.     No current facility-administered medications for this visit.     PHYSICAL EXAMINATION: ECOG PERFORMANCE STATUS: 1 - Symptomatic but completely ambulatory There were no vitals filed for this visit.  There were no vitals filed for this visit.   Physical Exam Constitutional:      General: She is not in acute distress. HENT:     Head: Normocephalic and atraumatic.  Eyes:     General: No scleral icterus. Cardiovascular:     Rate and Rhythm: Normal rate and regular rhythm.     Heart sounds: Normal heart sounds.  Pulmonary:     Effort: Pulmonary effort is normal. No respiratory distress.     Breath sounds: No wheezing.  Abdominal:     General: Bowel sounds are normal. There is no distension.     Palpations: Abdomen is soft.  Musculoskeletal:        General: No deformity. Normal range of motion.     Cervical back: Normal range of motion and neck supple.  Skin:    General: Skin is warm and dry.     Findings: No erythema or rash.  Neurological:     Mental Status: She is alert and oriented to person, place, and time. Mental status is at baseline.     Cranial Nerves: No cranial nerve deficit.     Coordination: Coordination normal.  Psychiatric:        Mood and Affect: Mood normal.          LABORATORY DATA:  I have reviewed the data as listed Lab Results  Component Value Date   WBC 10.5 12/11/2020   HGB 13.6 12/11/2020   HCT 42.3 12/11/2020   MCV 88.7  12/11/2020   PLT 302 12/11/2020   Recent Labs    12/11/20 1455  NA 137  K 3.8  CL 105  CO2 25  GLUCOSE 148*  BUN 10  CREATININE 1.15*  CALCIUM 8.3*   GFRNONAA 56*  PROT 6.6  ALBUMIN 3.6  AST 14*  ALT 13  ALKPHOS 90  BILITOT 0.4   Iron/TIBC/Ferritin/ %Sat    Component Value Date/Time   IRON 25 (L) 12/11/2020 1455   TIBC 386 12/11/2020 1455   FERRITIN 8 (L) 12/11/2020 1455   IRONPCTSAT 7 (L) 12/11/2020 1455     RADIOGRAPHIC STUDIES: I have personally reviewed the radiological images as listed and agreed with the findings in the report. No results found.      ASSESSMENT & PLAN: Meagan Bridges is a 55 year old female who presents back to clinic to discuss labs and additional IV iron.  1. Iron deficiency anemia, unspecified iron deficiency anemia type    Iron deficiency anemia-unclear etiology.  Patient had EGD/colonoscopy/capsule study without any etiology of her iron deficiency.  Denies any active bleeding.    She last received IV Venofer from 10/04/2020 - 11/02/2020.  Labs from 12/11/2020 show improvement of her hemoglobin from 10.7 to now 13.6.  MCV has improved to 88.7 although iron saturations and ferritin are still low.  Recommend 2 additional doses of IV iron.  I have asked her to trial oral iron starting with 1/day and adjusting as needed for GI symptoms.  We reviewed several different iron supplements available for her to take including ferrous sulfate, ferrous gluconate and Slow Fe.  Return to clinic in 10 weeks for follow-up with Dr. Tasia Catchings, labs and possible IV Venofer.  Disposition-iron today.RTC in 1 week for one additional dose. RTC in 10 weeks for md assess, lab and poss iron.  Start oral iron tablets as she is able to tolerate.  No orders of the defined types were placed in this encounter.   All questions were answered. The patient knows to call the clinic with any problems questions or concerns.  Cc Baxter Hire, MD  I spent 15 minutes dedicated to the care of this patient (face-to-face and non-face-to-face) on the date of the encounter to include what is described in the assessment and plan.  Meagan Casa,  NP 12/12/2020 1:35 PM

## 2020-12-12 NOTE — Patient Instructions (Signed)
[facility] [place] [facility]  Discharge Instructions: Thank you for choosing [facility] to provide your oncology and hematology care.  If you have a lab appointment with the Cancer Center, please go directly to the Cancer Center and check in at the registration area.  Wear comfortable clothing and clothing appropriate for easy access to any Portacath or PICC line.   We strive to give you quality time with your provider. You may need to reschedule your appointment if you arrive late (15 or more minutes).  Arriving late affects you and other patients whose appointments are after yours.  Also, if you miss three or more appointments without notifying the office, you may be dismissed from the clinic at the provider's discretion.      For prescription refill requests, have your pharmacy contact our office and allow 72 hours for refills to be completed.    Today you received the following chemotherapy and/or immunotherapy agents VENOFER      To help prevent nausea and vomiting after your treatment, we encourage you to take your nausea medication as directed.  BELOW ARE SYMPTOMS THAT SHOULD BE REPORTED IMMEDIATELY: *FEVER GREATER THAN 100.4 F (38 C) OR HIGHER *CHILLS OR SWEATING *NAUSEA AND VOMITING THAT IS NOT CONTROLLED WITH YOUR NAUSEA MEDICATION *UNUSUAL SHORTNESS OF BREATH *UNUSUAL BRUISING OR BLEEDING *URINARY PROBLEMS (pain or burning when urinating, or frequent urination) *BOWEL PROBLEMS (unusual diarrhea, constipation, pain near the anus) TENDERNESS IN MOUTH AND THROAT WITH OR WITHOUT PRESENCE OF ULCERS (sore throat, sores in mouth, or a toothache) UNUSUAL RASH, SWELLING OR PAIN  UNUSUAL VAGINAL DISCHARGE OR ITCHING   Items with * indicate a potential emergency and should be followed up as soon as possible or go to the Emergency Department if any problems should occur.  Please show the CHEMOTHERAPY ALERT CARD or IMMUNOTHERAPY ALERT CARD at check-in to  the Emergency Department and triage nurse.  Should you have questions after your visit or need to cancel or reschedule your appointment, please contact [facility] [place] [facility]  [phone] and follow the prompts.  Office hours are 8:00 a.m. to 4:30 p.m. Monday - Friday. Please note that voicemails left after 4:00 p.m. may not be returned until the following business day.  We are closed weekends and major holidays. You have access to a nurse at all times for urgent questions. Please call the main number to the clinic [phone] and follow the prompts.  For any non-urgent questions, you may also contact your provider using MyChart. We now offer e-Visits for anyone 18 and older to request care online for non-urgent symptoms. For details visit [email or web address].   Also download the MyChart app! Go to the app store, search "MyChart", open the app, select [facility], and log in with your MyChart username and password.  Due to Covid, a mask is required upon entering the hospital/clinic. If you do not have a mask, one will be given to you upon arrival. For doctor visits, patients may have 1 support person aged 18 or older with them. For treatment visits, patients cannot have anyone with them due to current Covid guidelines and our immunocompromised population.   Iron Sucrose Injection What is this medication? IRON SUCROSE (EYE ern SOO krose) treats low levels of iron (iron deficiency anemia) in people with kidney disease. Iron is a mineral that plays an important role in making red blood cells, which carry oxygen from your lungs to the rest of your body. This medicine may   be used for other purposes; ask your health care provider or pharmacist if you have questions. COMMON BRAND NAME(S): Venofer What should I tell my care team before I take this medication? They need to know if you have any of these conditions: Anemia not caused by low iron levels Heart disease High levels  of iron in the blood Kidney disease Liver disease An unusual or allergic reaction to iron, other medications, foods, dyes, or preservatives Pregnant or trying to get pregnant Breast-feeding How should I use this medication? This medication is for infusion into a vein. It is given in a hospital or clinic setting. Talk to your care team about the use of this medication in children. While this medication may be prescribed for children as young as 2 years for selected conditions, precautions do apply. Overdosage: If you think you have taken too much of this medicine contact a poison control center or emergency room at once. NOTE: This medicine is only for you. Do not share this medicine with others. What if I miss a dose? It is important not to miss your dose. Call your care team if you are unable to keep an appointment. What may interact with this medication? Do not take this medication with any of the following: Deferoxamine Dimercaprol Other iron products This medication may also interact with the following: Chloramphenicol Deferasirox This list may not describe all possible interactions. Give your health care provider a list of all the medicines, herbs, non-prescription drugs, or dietary supplements you use. Also tell them if you smoke, drink alcohol, or use illegal drugs. Some items may interact with your medicine. What should I watch for while using this medication? Visit your care team regularly. Tell your care team if your symptoms do not start to get better or if they get worse. You may need blood work done while you are taking this medication. You may need to follow a special diet. Talk to your care team. Foods that contain iron include: whole grains/cereals, dried fruits, beans, or peas, leafy green vegetables, and organ meats (liver, kidney). What side effects may I notice from receiving this medication? Side effects that you should report to your care team as soon as  possible: Allergic reactions-skin rash, itching, hives, swelling of the face, lips, tongue, or throat Low blood pressure-dizziness, feeling faint or lightheaded, blurry vision Shortness of breath Side effects that usually do not require medical attention (report to your care team if they continue or are bothersome): Flushing Headache Joint pain Muscle pain Nausea Pain, redness, or irritation at injection site This list may not describe all possible side effects. Call your doctor for medical advice about side effects. You may report side effects to FDA at 1-800-FDA-1088. Where should I keep my medication? This medication is given in a hospital or clinic and will not be stored at home. NOTE: This sheet is a summary. It may not cover all possible information. If you have questions about this medicine, talk to your doctor, pharmacist, or health care provider.  2022 Elsevier/Gold Standard (2020-04-25 12:52:06)  

## 2020-12-13 ENCOUNTER — Ambulatory Visit: Payer: BC Managed Care – PPO

## 2020-12-13 ENCOUNTER — Ambulatory Visit: Payer: BC Managed Care – PPO | Admitting: Oncology

## 2020-12-19 ENCOUNTER — Inpatient Hospital Stay: Payer: BC Managed Care – PPO

## 2020-12-20 ENCOUNTER — Telehealth: Payer: Self-pay | Admitting: Oncology

## 2020-12-20 NOTE — Telephone Encounter (Signed)
Patient left message with answering service that she missed her 11/8 infusion appointment and requests a call back to reschedule.  Routing to scheduler for follow up.

## 2020-12-25 ENCOUNTER — Other Ambulatory Visit: Payer: Self-pay

## 2020-12-25 ENCOUNTER — Inpatient Hospital Stay: Payer: BC Managed Care – PPO

## 2020-12-25 VITALS — BP 135/68 | HR 87 | Temp 97.2°F | Resp 20

## 2020-12-25 DIAGNOSIS — D509 Iron deficiency anemia, unspecified: Secondary | ICD-10-CM | POA: Diagnosis not present

## 2020-12-25 MED ORDER — IRON SUCROSE 20 MG/ML IV SOLN
200.0000 mg | Freq: Once | INTRAVENOUS | Status: AC
  Administered 2020-12-25: 200 mg via INTRAVENOUS
  Filled 2020-12-25: qty 10

## 2020-12-25 MED ORDER — SODIUM CHLORIDE 0.9 % IV SOLN
Freq: Once | INTRAVENOUS | Status: AC
  Filled 2020-12-25: qty 250

## 2020-12-25 MED ORDER — SODIUM CHLORIDE 0.9 % IV SOLN: 200.0000 mg | Freq: Once | INTRAVENOUS | Status: DC

## 2021-02-07 ENCOUNTER — Other Ambulatory Visit: Payer: Self-pay | Admitting: *Deleted

## 2021-02-07 DIAGNOSIS — D509 Iron deficiency anemia, unspecified: Secondary | ICD-10-CM

## 2021-02-07 DIAGNOSIS — E538 Deficiency of other specified B group vitamins: Secondary | ICD-10-CM

## 2021-02-16 ENCOUNTER — Inpatient Hospital Stay: Payer: BC Managed Care – PPO | Attending: Oncology

## 2021-02-16 ENCOUNTER — Other Ambulatory Visit: Payer: Self-pay

## 2021-02-16 DIAGNOSIS — D509 Iron deficiency anemia, unspecified: Secondary | ICD-10-CM | POA: Diagnosis present

## 2021-02-16 DIAGNOSIS — E538 Deficiency of other specified B group vitamins: Secondary | ICD-10-CM

## 2021-02-16 DIAGNOSIS — Z79899 Other long term (current) drug therapy: Secondary | ICD-10-CM | POA: Insufficient documentation

## 2021-02-16 LAB — CBC WITH DIFFERENTIAL/PLATELET
Abs Immature Granulocytes: 0.03 10*3/uL (ref 0.00–0.07)
Basophils Absolute: 0 10*3/uL (ref 0.0–0.1)
Basophils Relative: 0 %
Eosinophils Absolute: 0.2 10*3/uL (ref 0.0–0.5)
Eosinophils Relative: 2 %
HCT: 44 % (ref 36.0–46.0)
Hemoglobin: 14.4 g/dL (ref 12.0–15.0)
Immature Granulocytes: 0 %
Lymphocytes Relative: 35 %
Lymphs Abs: 3.2 10*3/uL (ref 0.7–4.0)
MCH: 29.8 pg (ref 26.0–34.0)
MCHC: 32.7 g/dL (ref 30.0–36.0)
MCV: 90.9 fL (ref 80.0–100.0)
Monocytes Absolute: 0.5 10*3/uL (ref 0.1–1.0)
Monocytes Relative: 5 %
Neutro Abs: 5.4 10*3/uL (ref 1.7–7.7)
Neutrophils Relative %: 58 %
Platelets: 302 10*3/uL (ref 150–400)
RBC: 4.84 MIL/uL (ref 3.87–5.11)
RDW: 15.1 % (ref 11.5–15.5)
WBC: 9.4 10*3/uL (ref 4.0–10.5)
nRBC: 0 % (ref 0.0–0.2)

## 2021-02-16 LAB — VITAMIN B12: Vitamin B-12: 144 pg/mL — ABNORMAL LOW (ref 180–914)

## 2021-02-16 LAB — FERRITIN: Ferritin: 16 ng/mL (ref 11–307)

## 2021-02-16 LAB — IRON AND TIBC
Iron: 32 ug/dL (ref 28–170)
Saturation Ratios: 9 % — ABNORMAL LOW (ref 10.4–31.8)
TIBC: 347 ug/dL (ref 250–450)
UIBC: 315 ug/dL

## 2021-02-20 ENCOUNTER — Inpatient Hospital Stay: Payer: BC Managed Care – PPO | Admitting: Oncology

## 2021-02-20 ENCOUNTER — Inpatient Hospital Stay: Payer: BC Managed Care – PPO

## 2021-02-23 ENCOUNTER — Telehealth: Payer: Self-pay | Admitting: Oncology

## 2021-02-23 NOTE — Telephone Encounter (Signed)
Pt called to reschedule her appt for 1-10. Call back at [phone]

## 2021-03-20 ENCOUNTER — Other Ambulatory Visit: Payer: Self-pay | Admitting: Internal Medicine

## 2021-03-20 DIAGNOSIS — Z1231 Encounter for screening mammogram for malignant neoplasm of breast: Secondary | ICD-10-CM

## 2021-04-03 ENCOUNTER — Inpatient Hospital Stay: Payer: BC Managed Care – PPO

## 2021-04-03 ENCOUNTER — Encounter: Payer: Self-pay | Admitting: Oncology

## 2021-04-03 ENCOUNTER — Other Ambulatory Visit: Payer: Self-pay

## 2021-04-03 ENCOUNTER — Inpatient Hospital Stay: Payer: BC Managed Care – PPO | Attending: Oncology | Admitting: Oncology

## 2021-04-03 VITALS — BP 128/66 | HR 84 | Resp 19

## 2021-04-03 VITALS — BP 132/77 | HR 89 | Temp 97.7°F | Wt 165.0 lb

## 2021-04-03 DIAGNOSIS — D509 Iron deficiency anemia, unspecified: Secondary | ICD-10-CM

## 2021-04-03 DIAGNOSIS — D508 Other iron deficiency anemias: Secondary | ICD-10-CM | POA: Diagnosis present

## 2021-04-03 DIAGNOSIS — E538 Deficiency of other specified B group vitamins: Secondary | ICD-10-CM

## 2021-04-03 DIAGNOSIS — Z720 Tobacco use: Secondary | ICD-10-CM

## 2021-04-03 MED ORDER — IRON SUCROSE 20 MG/ML IV SOLN
200.0000 mg | Freq: Once | INTRAVENOUS | Status: AC
  Administered 2021-04-03: 200 mg via INTRAVENOUS
  Filled 2021-04-03: qty 10

## 2021-04-03 MED ORDER — SODIUM CHLORIDE 0.9 % IV SOLN
INTRAVENOUS | Status: DC
  Filled 2021-04-03: qty 250

## 2021-04-03 MED ORDER — SODIUM CHLORIDE 0.9 % IV SOLN: 200.0000 mg | Freq: Once | INTRAVENOUS | Status: DC

## 2021-04-03 NOTE — Patient Instructions (Signed)

## 2021-04-03 NOTE — Progress Notes (Signed)
Patient here for follow up. Patient denies any concerns. °

## 2021-04-03 NOTE — Progress Notes (Signed)
Hematology/Oncology Progress note [phone] [phone]      Patient Care Team: Baxter Hire, MD as PCP - General (Internal Medicine) Earlie Server, MD as Consulting Physician (Hematology and Oncology)  REFERRING PROVIDER: Baxter Hire, MD CHIEF COMPLAINTS/REASON FOR VISIT:   iron deficiency anemia  HISTORY OF PRESENTING ILLNESS:  Meagan Bridges is a  56 y.o.  female with PMH listed below was seen in consultation at the request of Baxter Hire, MD   for evaluation of iron deficiency anemia.   Reviewed patient's recent labs that are done at [facility] clinic. 01/19/2020 labs revealed anemia with hemoglobin of 8.1, MCV 72, hematocrit 28. Normal WBC and platelet count.   Reviewed patient's previous labs ordered by primary care physician's office, anemia is new onset, approximately from November 2021. Patient had a normal hemoglobin of 13.9 on 06/21/2019. She denies any black or bloody stool. She reports some dysphagia symptoms. History of GERD. She has been seen by another clinic gastroenterology and has scheduled for colonoscopy and  EGD.  Associated signs and symptoms: Patient reports fatigue and lack of energy.. Mild SOB with exertion.  Denies weight loss, easy bruising, hematochezia, hemoptysis, hematuria. Context:  History of iron deficiency: Denies Rectal bleeding: Denies Menstrual bleeding/ Vaginal bleeding : He has had endometrial ablation  Hematemesis or hemoptysis : denies Blood in urine : denies  Patient takes Eliquis for paroxysmal atrial fibrillation managed by Dr. Clayborn Bigness Patient smokes daily. Denies any alcohol use.   INTERVAL HISTORY Meagan Bridges is a 56 y.o. female who has above history reviewed by me today presents for follow up visit for management of iron deficiency anemia. Problems and complaints are listed below: Patient was accompanied by her husband Patient had a blood work done in January 2023, rescheduled her follow-up  appointment with me to today. No new complaints.   Review of Systems  Constitutional:  Positive for fatigue. Negative for appetite change, chills and fever.  HENT:   Negative for hearing loss and voice change.   Eyes:  Negative for eye problems.  Respiratory:  Negative for chest tightness and cough.   Cardiovascular:  Negative for chest pain.  Gastrointestinal:  Negative for abdominal distention, abdominal pain and blood in stool.  Endocrine: Negative for hot flashes.  Genitourinary:  Negative for difficulty urinating and frequency.   Musculoskeletal:  Negative for arthralgias.  Skin:  Negative for itching and rash.  Neurological:  Negative for extremity weakness.  Hematological:  Negative for adenopathy.  Psychiatric/Behavioral:  Negative for confusion.    MEDICAL HISTORY:  Past Medical History:  Diagnosis Date   Anxiety    Back pain    Chronic pain disorder    COPD (chronic obstructive pulmonary disease) (HCC)    Depression    Diabetes mellitus without complication (HCC)    Dysrhythmia    tachycardia   GERD (gastroesophageal reflux disease)    Heavy smoker    3-4ppd   IBS (irritable bowel syndrome)    Migraines     SURGICAL HISTORY: Past Surgical History:  Procedure Laterality Date   BACK SURGERY     CHOLECYSTECTOMY     COLONOSCOPY WITH PROPOFOL N/A 03/11/2016   Procedure: COLONOSCOPY WITH PROPOFOL;  Surgeon: Manya Silvas, MD;  Location: [facility];  Service: Endoscopy;  Laterality: N/A;   COLONOSCOPY WITH PROPOFOL N/A 01/26/2020   Procedure: COLONOSCOPY WITH PROPOFOL;  Surgeon: Toledo, Benay Pike, MD;  Location: [facility];  Service: Gastroenterology;  Laterality: N/A;   ESOPHAGOGASTRODUODENOSCOPY N/A  01/26/2020   Procedure: ESOPHAGOGASTRODUODENOSCOPY (EGD);  Surgeon: Toledo, Benay Pike, MD;  Location: [facility] END[facility];  Service: Gastroenterology;  Laterality: N/A;   LUMBAR DISC SURGERY  2002   MICROLARYNGOSCOPY N/A 04/20/2018   Procedure: MICROLARYNGOSCOPY  with BIOPSY;  Surgeon: Izora Gala, MD;  Location: [facility];  Service: ENT;  Laterality: N/A;   UTERINE FIBROID SURGERY  2000    SOCIAL HISTORY: Social History   Socioeconomic History   Marital status: Married    Spouse name: Not on file   Number of children: Not on file   Years of education: Not on file   Highest education level: Not on file  Occupational History   Not on file  Tobacco Use   Smoking status: Every Day    Packs/day: 2.00    Years: 40.00    Pack years: 80.00    Types: Cigarettes   Smokeless tobacco: Never  Vaping Use   Vaping Use: Never used  Substance and Sexual Activity   Alcohol use: No    Alcohol/week: 0.0 standard drinks   Drug use: No   Sexual activity: Yes    Birth control/protection: Post-menopausal  Other Topics Concern   Not on file  Social History Narrative   Not on file   Social Determinants of Health   Financial Resource Strain: Not on file  Food Insecurity: Not on file  Transportation Needs: Not on file  Physical Activity: Not on file  Stress: Not on file  Social Connections: Not on file  Intimate Partner Violence: Not on file    FAMILY HISTORY: Family History  Problem Relation Age of Onset   Diabetes Mother    Kidney disease Mother    Miscarriages / [place] Mother    Diabetes Father    Early death Father    Hearing loss Father    Heart disease Father    Hyperlipidemia Father    Hypertension Father    Learning disabilities Father    Breast cancer Neg Hx     ALLERGIES:  is allergic to latex, niacin, and tape.  MEDICATIONS:  Current Outpatient Medications  Medication Sig Dispense Refill   albuterol (PROVENTIL HFA;VENTOLIN HFA) 108 (90 Base) MCG/ACT inhaler Inhale into the lungs every 6 (six) hours as needed for wheezing or shortness of breath.     amitriptyline (ELAVIL) 25 MG tablet Take 100 mg by mouth at bedtime.  1   apixaban (ELIQUIS) 5 MG TABS tablet Take 1 tablet by mouth every 12 (twelve)  hours.     Biotin 5000 MCG TABS Take 1 tablet by mouth daily.     buPROPion (WELLBUTRIN XL) 150 MG 24 hr tablet Take 150 mg by mouth daily.     busPIRone (BUSPAR) 15 MG tablet Take 30 mg by mouth 3 (three) times daily.      Cariprazine HCl 4.5 MG CAPS Take by mouth at bedtime.     cholecalciferol (VITAMIN D3) 25 MCG (1000 UT) tablet Take 1,000 Units by mouth daily.     cyclobenzaprine (FLEXERIL) 10 MG tablet Limit 1 tablet by mouth 2 - 3 times per day if tolerated 90 tablet 0   diclofenac Sodium (VOLTAREN) 1 % GEL APPLY TWO TO FOUR GRAMS TO PAINFUL AREAS OF SKIN FOUR TIMES EACH DAY.     diltiazem (DILACOR XR) 180 MG 24 hr capsule Take 120 mg by mouth daily.      Dulaglutide 0.75 MG/0.5ML SOPN Inject 1.5 mg into the skin once a week.  DULoxetine (CYMBALTA) 60 MG capsule Take 60 mg by mouth 2 (two) times daily.     empagliflozin (JARDIANCE) 25 MG TABS tablet Take 25 mg by mouth daily.     esomeprazole (NEXIUM) 20 MG packet Take 20 mg by mouth daily before breakfast.     fexofenadine (ALLEGRA) 180 MG tablet Take 180 mg by mouth daily.     gabapentin (NEURONTIN) 100 MG capsule Take 100 mg by mouth 3 (three) times daily. 100 mg in the morning and at lunch. 200 mg at bedtime.     glipiZIDE (GLUCOTROL) 10 MG tablet Take 10 mg by mouth 2 (two) times daily before a meal.     HYDROcodone-acetaminophen (NORCO) 10-325 MG tablet Take 1 tablet by mouth every 6 (six) hours as needed for severe pain.     magnesium gluconate (MAGONATE) 500 MG tablet Take by mouth.     metFORMIN (GLUCOPHAGE) 500 MG tablet Take 1,000 mg by mouth 2 (two) times daily with a meal.      methylphenidate (RITALIN) 20 MG tablet Take 20 mg by mouth daily.     omeprazole (PRILOSEC) 20 MG capsule Take 20 mg by mouth daily.     perphenazine (TRILAFON) 4 MG tablet Take 4 mg by mouth at bedtime.     tiZANidine (ZANAFLEX) 2 MG tablet LIMIT 1 TABLET BY MOUTH 2   4 TIMES PER DAY IF TOLERATED. NO CYCLOBENZAPRINE NO FLEXERIL      umeclidinium-vilanterol (ANORO ELLIPTA) 62.5-25 MCG/INH AEPB Inhale 1 puff into the lungs daily.     fluticasone (FLONASE) 50 MCG/ACT nasal spray Place into both nostrils daily. (Patient not taking: Reported on 10/04/2020)     No current facility-administered medications for this visit.     PHYSICAL EXAMINATION: ECOG PERFORMANCE STATUS: 1 - Symptomatic but completely ambulatory Vitals:   04/03/21 1423  BP: 132/77  Pulse: 89  Temp: 97.7 F (36.5 C)   Filed Weights   04/03/21 1423  Weight: 165 lb (74.8 kg)    Physical Exam Constitutional:      General: She is not in acute distress. HENT:     Head: Normocephalic and atraumatic.  Eyes:     General: No scleral icterus. Cardiovascular:     Rate and Rhythm: Normal rate and regular rhythm.     Heart sounds: Normal heart sounds.  Pulmonary:     Effort: Pulmonary effort is normal. No respiratory distress.     Breath sounds: No wheezing.  Abdominal:     General: Bowel sounds are normal. There is no distension.     Palpations: Abdomen is soft.  Musculoskeletal:        General: No deformity. Normal range of motion.     Cervical back: Normal range of motion and neck supple.  Skin:    General: Skin is warm and dry.     Findings: No erythema or rash.  Neurological:     Mental Status: She is alert and oriented to person, place, and time. Mental status is at baseline.     Cranial Nerves: No cranial nerve deficit.     Coordination: Coordination normal.  Psychiatric:        Mood and Affect: Mood normal.          LABORATORY DATA:  I have reviewed the data as listed Lab Results  Component Value Date   WBC 9.4 02/16/2021   HGB 14.4 02/16/2021   HCT 44.0 02/16/2021   MCV 90.9 02/16/2021   PLT 302 02/16/2021   Recent  Labs    12/11/20 1455  NA 137  K 3.8  CL 105  CO2 25  GLUCOSE 148*  BUN 10  CREATININE 1.15*  CALCIUM 8.3*  GFRNONAA 56*  PROT 6.6  ALBUMIN 3.6  AST 14*  ALT 13  ALKPHOS 90  BILITOT 0.4    Iron/TIBC/Ferritin/ %Sat    Component Value Date/Time   IRON 32 02/16/2021 1311   TIBC 347 02/16/2021 1311   FERRITIN 16 02/16/2021 1311   IRONPCTSAT 9 (L) 02/16/2021 1311     RADIOGRAPHIC STUDIES: I have personally reviewed the radiological images as listed and agreed with the findings in the report. No results found.      ASSESSMENT & PLAN:  1. Other iron deficiency anemia   2. Tobacco use   3. B12 deficiency    Labs reviewed and discussed with patient. Iron deficiency anemia, Hemoglobin has improved to 14.4, ferritin improved to 16, iron saturation is still low at 9. Recommend patient to have IV Venofer weekly x2.  Recommend patient to see gastroenterology for further evaluation for persistent iron deficiency anemia.  She has a history of gastritis.  Vitamin B12 deficiency.  B12 level is at 144.  Offered patient parenteral vitamin B12 injections.  Patient declines and prefers to take oral vitamin B12 supplementation.  We will repeat B12 the next visit.  Tobacco use, smoke cessation was discussed with patient. 80-pack-year smoking history, recommend patient to have lung cancer screening done.  Refer to lung cancer screening program.  Orders Placed This Encounter  Procedures   CBC with Differential/Platelet    Standing Status:   Future    Standing Expiration Date:   04/03/2022   Ferritin    Standing Status:   Future    Standing Expiration Date:   04/03/2022   Iron and TIBC    Standing Status:   Future    Standing Expiration Date:   04/03/2022   Ambulatory Referral for Lung Cancer Scre    Referral Priority:   Routine    Referral Type:   Consultation    Referral Reason:   Specialty Services Required    Number of Visits Requested:   1    All questions were answered. The patient knows to call the clinic with any problems questions or concerns.  Cc Baxter Hire, MD  Return of visit:  10 weeks Thank you for this kind referral and the opportunity to participate in  the care of this patient. A copy of today's note is routed to referring provider   Earlie Server, MD, PhD Hematology Oncology [place] at [facility] 04/03/2021

## 2021-04-09 ENCOUNTER — Other Ambulatory Visit: Payer: BC Managed Care – PPO

## 2021-04-10 ENCOUNTER — Ambulatory Visit: Payer: BC Managed Care – PPO | Admitting: Oncology

## 2021-04-10 ENCOUNTER — Ambulatory Visit: Payer: BC Managed Care – PPO

## 2021-04-10 ENCOUNTER — Inpatient Hospital Stay: Payer: BC Managed Care – PPO

## 2021-04-11 ENCOUNTER — Ambulatory Visit: Payer: BC Managed Care – PPO

## 2021-04-11 ENCOUNTER — Ambulatory Visit: Payer: BC Managed Care – PPO | Admitting: Oncology

## 2021-04-24 ENCOUNTER — Other Ambulatory Visit: Payer: Self-pay

## 2021-04-24 ENCOUNTER — Ambulatory Visit
Admission: RE | Admit: 2021-04-24 | Discharge: 2021-04-24 | Disposition: A | Discharge status: Home / Self Care | Payer: BC Managed Care – PPO | Source: Ambulatory Visit | Attending: Internal Medicine | Admitting: Internal Medicine

## 2021-04-24 DIAGNOSIS — Z1231 Encounter for screening mammogram for malignant neoplasm of breast: Secondary | ICD-10-CM | POA: Diagnosis present

## 2021-10-01 ENCOUNTER — Inpatient Hospital Stay: Payer: BC Managed Care – PPO | Attending: Oncology

## 2021-10-01 MED FILL — Iron Sucrose Inj 20 MG/ML (Fe Equiv): INTRAVENOUS | Qty: 10 | Status: AC

## 2021-10-02 ENCOUNTER — Inpatient Hospital Stay: Payer: BC Managed Care – PPO | Admitting: Oncology

## 2021-10-02 ENCOUNTER — Inpatient Hospital Stay: Payer: BC Managed Care – PPO

## 2021-10-03 ENCOUNTER — Encounter: Payer: Self-pay | Admitting: Oncology

## 2021-10-03 NOTE — Progress Notes (Signed)
No show

## 2022-05-07 ENCOUNTER — Other Ambulatory Visit: Payer: Self-pay | Admitting: Internal Medicine

## 2022-05-07 DIAGNOSIS — Z1231 Encounter for screening mammogram for malignant neoplasm of breast: Secondary | ICD-10-CM

## 2022-05-10 ENCOUNTER — Encounter: Payer: Self-pay | Admitting: Oncology

## 2022-05-17 ENCOUNTER — Ambulatory Visit
Admission: RE | Admit: 2022-05-17 | Discharge: 2022-05-17 | Disposition: A | Discharge status: Home / Self Care | Payer: BLUE CROSS/BLUE SHIELD | Source: Ambulatory Visit | Attending: Internal Medicine | Admitting: Internal Medicine

## 2022-05-17 DIAGNOSIS — Z1231 Encounter for screening mammogram for malignant neoplasm of breast: Secondary | ICD-10-CM | POA: Insufficient documentation

## 2023-01-30 ENCOUNTER — Other Ambulatory Visit: Payer: Self-pay | Admitting: Specialist

## 2023-01-30 DIAGNOSIS — R911 Solitary pulmonary nodule: Secondary | ICD-10-CM

## 2023-02-01 ENCOUNTER — Encounter: Payer: Self-pay | Admitting: Oncology

## 2023-02-04 ENCOUNTER — Ambulatory Visit
Admission: RE | Admit: 2023-02-04 | Discharge: 2023-02-04 | Disposition: A | Discharge status: Home / Self Care | Payer: BLUE CROSS/BLUE SHIELD | Source: Ambulatory Visit | Attending: Specialist | Admitting: Specialist

## 2023-02-04 DIAGNOSIS — R911 Solitary pulmonary nodule: Secondary | ICD-10-CM | POA: Diagnosis present

## 2023-02-19 ENCOUNTER — Other Ambulatory Visit: Payer: Self-pay | Admitting: Specialist

## 2023-02-19 DIAGNOSIS — R918 Other nonspecific abnormal finding of lung field: Secondary | ICD-10-CM

## 2023-02-19 DIAGNOSIS — R9389 Abnormal findings on diagnostic imaging of other specified body structures: Secondary | ICD-10-CM

## 2023-02-25 ENCOUNTER — Ambulatory Visit
Admission: RE | Admit: 2023-02-25 | Discharge: 2023-02-25 | Disposition: A | Discharge status: Home / Self Care | Payer: BLUE CROSS/BLUE SHIELD | Source: Ambulatory Visit | Attending: Specialist | Admitting: Specialist

## 2023-02-25 DIAGNOSIS — R918 Other nonspecific abnormal finding of lung field: Secondary | ICD-10-CM | POA: Diagnosis present

## 2023-02-25 DIAGNOSIS — I700 Atherosclerosis of aorta: Secondary | ICD-10-CM | POA: Diagnosis not present

## 2023-02-25 DIAGNOSIS — R9389 Abnormal findings on diagnostic imaging of other specified body structures: Secondary | ICD-10-CM | POA: Insufficient documentation

## 2023-02-25 DIAGNOSIS — J439 Emphysema, unspecified: Secondary | ICD-10-CM | POA: Insufficient documentation

## 2023-02-25 LAB — GLUCOSE, CAPILLARY: Glucose-Capillary: 96 mg/dL (ref 70–99)

## 2023-02-25 MED ORDER — FLUDEOXYGLUCOSE F - 18 (FDG) INJECTION
8.8500 | Freq: Once | INTRAVENOUS | Status: AC | PRN
  Administered 2023-02-25: 8.85 via INTRAVENOUS

## 2023-03-21 ENCOUNTER — Telehealth: Payer: Self-pay | Admitting: *Deleted

## 2023-03-21 NOTE — Telephone Encounter (Signed)
 Patient called today and she says that she was a person of Dr. Layvonne team a little while ago and she now needs to come back because she has low iron  and needs infusions.  I called the patient back got her voicemail and told her that Dr. Babara was not here today but she will be in the office next week and I am sending a message to the team so that she could get an appointment

## 2023-03-21 NOTE — Telephone Encounter (Signed)
 Ok to schedule MD follow up to re-establish care for IDA.

## 2023-03-28 NOTE — Progress Notes (Signed)
Oley Balm, MD sent to Mertie Moores, MD Cc: Markus Daft Too small Rec thoracic surg consult for wedge resection  [facility]

## 2023-04-04 ENCOUNTER — Inpatient Hospital Stay: Payer: BLUE CROSS/BLUE SHIELD | Admitting: Oncology

## 2023-04-21 ENCOUNTER — Inpatient Hospital Stay

## 2023-04-21 ENCOUNTER — Encounter: Payer: Self-pay | Admitting: Oncology

## 2023-04-21 ENCOUNTER — Inpatient Hospital Stay: Payer: BLUE CROSS/BLUE SHIELD | Attending: Oncology | Admitting: Oncology

## 2023-04-21 VITALS — BP 130/56 | HR 109 | Temp 97.4°F | Resp 18 | Wt 148.5 lb

## 2023-04-21 DIAGNOSIS — R918 Other nonspecific abnormal finding of lung field: Secondary | ICD-10-CM | POA: Diagnosis not present

## 2023-04-21 DIAGNOSIS — E538 Deficiency of other specified B group vitamins: Secondary | ICD-10-CM | POA: Insufficient documentation

## 2023-04-21 DIAGNOSIS — Z79899 Other long term (current) drug therapy: Secondary | ICD-10-CM | POA: Diagnosis not present

## 2023-04-21 DIAGNOSIS — D509 Iron deficiency anemia, unspecified: Secondary | ICD-10-CM | POA: Insufficient documentation

## 2023-04-21 DIAGNOSIS — D508 Other iron deficiency anemias: Secondary | ICD-10-CM

## 2023-04-21 DIAGNOSIS — R42 Dizziness and giddiness: Secondary | ICD-10-CM | POA: Diagnosis not present

## 2023-04-21 LAB — CBC WITH DIFFERENTIAL/PLATELET
Abs Immature Granulocytes: 0.19 10*3/uL — ABNORMAL HIGH (ref 0.00–0.07)
Basophils Absolute: 0.1 10*3/uL (ref 0.0–0.1)
Basophils Relative: 0 %
Eosinophils Absolute: 0.1 10*3/uL (ref 0.0–0.5)
Eosinophils Relative: 1 %
HCT: 26 % — ABNORMAL LOW (ref 36.0–46.0)
Hemoglobin: 7.2 g/dL — ABNORMAL LOW (ref 12.0–15.0)
Immature Granulocytes: 1 %
Lymphocytes Relative: 19 %
Lymphs Abs: 2.6 10*3/uL (ref 0.7–4.0)
MCH: 19.3 pg — ABNORMAL LOW (ref 26.0–34.0)
MCHC: 27.7 g/dL — ABNORMAL LOW (ref 30.0–36.0)
MCV: 69.7 fL — ABNORMAL LOW (ref 80.0–100.0)
Monocytes Absolute: 0.6 10*3/uL (ref 0.1–1.0)
Monocytes Relative: 4 %
Neutro Abs: 10.4 10*3/uL — ABNORMAL HIGH (ref 1.7–7.7)
Neutrophils Relative %: 75 %
Platelets: 464 10*3/uL — ABNORMAL HIGH (ref 150–400)
RBC: 3.73 MIL/uL — ABNORMAL LOW (ref 3.87–5.11)
RDW: 18.6 % — ABNORMAL HIGH (ref 11.5–15.5)
WBC: 14 10*3/uL — ABNORMAL HIGH (ref 4.0–10.5)
nRBC: 0 % (ref 0.0–0.2)

## 2023-04-21 LAB — ABO/RH: ABO/RH(D): AB POS

## 2023-04-21 LAB — IRON AND TIBC
Iron: 13 ug/dL — ABNORMAL LOW (ref 28–170)
Saturation Ratios: 3 % — ABNORMAL LOW (ref 10.4–31.8)
TIBC: 510 ug/dL — ABNORMAL HIGH (ref 250–450)
UIBC: 497 ug/dL

## 2023-04-21 LAB — RETIC PANEL
Immature Retic Fract: 4.1 % (ref 2.3–15.9)
RBC.: 3.55 MIL/uL — ABNORMAL LOW (ref 3.87–5.11)
Retic Count, Absolute: 91 10*3/uL (ref 19.0–186.0)
Retic Ct Pct: 2.6 % (ref 0.4–3.1)
Reticulocyte Hemoglobin: 14.7 pg — ABNORMAL LOW (ref >27.9)

## 2023-04-21 LAB — SAMPLE TO BLOOD BANK

## 2023-04-21 LAB — FERRITIN: Ferritin: 4 ng/mL — ABNORMAL LOW (ref 11–307)

## 2023-04-21 LAB — VITAMIN B12: Vitamin B-12: 202 pg/mL (ref 180–914)

## 2023-04-21 LAB — FOLATE: Folate: 12.8 ng/mL (ref >5.9)

## 2023-04-21 NOTE — Assessment & Plan Note (Addendum)
 Lab Results  Component Value Date   HGB 7.2 (L) 04/21/2023   TIBC 510 (H) 04/21/2023   IRONPCTSAT 3 (L) 04/21/2023   FERRITIN 4 (L) 04/21/2023   Recommend IV Venfoer weekly x 4  Recommend patient to follow-up with gastroenterology.  She has a history of gastritis.

## 2023-04-21 NOTE — Assessment & Plan Note (Signed)
 History of B12 deficiency.  Previously she declined  B12 injections. Repeat B12 level.

## 2023-04-21 NOTE — Assessment & Plan Note (Signed)
 Patient had an episode of lightheadedness after standing up.   Patient had hypotension of 82/60, tachycardic with heart rate of 108 After oral hydration, BP improved and lightheadedness resolved. Encouraged oral hydration.  ER triggers discussed with patient.

## 2023-04-21 NOTE — Assessment & Plan Note (Signed)
 Follow-up with [facility]ology for biopsy and management.

## 2023-04-21 NOTE — Progress Notes (Signed)
 Hematology/Oncology Progress note [phone] [phone]      Patient Care Team: Gracelyn Nurse, MD as PCP - General (Internal Medicine) Rickard Patience, MD as Consulting Physician (Hematology and Oncology)   CHIEF COMPLAINTS/REASON FOR VISIT:   iron deficiency anemia  ASSESSMENT & PLAN:   IDA (iron deficiency anemia) Lab Results  Component Value Date   HGB 7.2 (L) 04/21/2023   TIBC 510 (H) 04/21/2023   IRONPCTSAT 3 (L) 04/21/2023   FERRITIN 4 (L) 04/21/2023   Recommend IV Venfoer weekly x 4  Recommend patient to follow-up with gastroenterology.  She has a history of gastritis.    Episodic lightheadedness Patient had an episode of lightheadedness after standing up.   Patient had hypotension of 82/60, tachycardic with heart rate of 108 After oral hydration, BP improved and lightheadedness resolved. Encouraged oral hydration.  ER triggers discussed with patient.  B12 deficiency History of B12 deficiency.  Previously she declined  B12 injections. Repeat B12 level.  Lung nodules Follow-up with [facility] oncology for biopsy and management.  Orders Placed This Encounter  Procedures   Ferritin    Standing Status:   Future    Number of Occurrences:   1    Expected Date:   04/21/2023    Expiration Date:   10/22/2023   Iron and TIBC    Standing Status:   Future    Number of Occurrences:   1    Expected Date:   04/21/2023    Expiration Date:   04/20/2024   CBC with Differential/Platelet    Standing Status:   Future    Number of Occurrences:   1    Expected Date:   04/21/2023    Expiration Date:   04/20/2024   Retic Panel    Standing Status:   Future    Number of Occurrences:   1    Expected Date:   04/21/2023    Expiration Date:   04/20/2024   Vitamin B12    Standing Status:   Future    Number of Occurrences:   1    Expected Date:   04/21/2023    Expiration Date:   04/20/2024   Folate    Standing Status:   Future    Number of Occurrences:   1    Expected  Date:   04/21/2023    Expiration Date:   04/20/2024   Hold Tube- Blood Bank    Standing Status:   Future    Number of Occurrences:   1    Expected Date:   04/21/2023    Expiration Date:   04/20/2024   ABO/Rh    Standing Status:   Future    Number of Occurrences:   1    Expected Date:   04/21/2023    Expiration Date:   04/20/2024   Follow-up in 2 months All questions were answered. The patient knows to call the clinic with any problems, questions or concerns.  Rickard Patience, MD, PhD [facility] Hematology Oncology 04/21/2023    HISTORY OF PRESENTING ILLNESS:  Meagan Bridges is a  58 y.o.  female with PMH listed below was seen in consultation at the request of Gracelyn Nurse, MD   for evaluation of iron deficiency anemia.   Reviewed patient's recent labs that are done at [facility] clinic. 01/19/2020 labs revealed anemia with hemoglobin of 8.1, MCV 72, hematocrit 28. Normal WBC and platelet count.   Reviewed patient's previous labs ordered by primary care physician's office,  anemia is new onset, approximately from November 2021. Patient had a normal hemoglobin of 13.9 on 06/21/2019. She denies any black or bloody stool. She reports some dysphagia symptoms. History of GERD. She has been seen by another clinic gastroenterology and has scheduled for colonoscopy and  EGD.  Associated signs and symptoms: Patient reports fatigue and lack of energy.. Mild SOB with exertion.  Denies weight loss, easy bruising, hematochezia, hemoptysis, hematuria. Context:  History of iron deficiency: Denies Rectal bleeding: Denies Menstrual bleeding/ Vaginal bleeding :  had endometrial ablation  Hematemesis or hemoptysis : denies Blood in urine : denies  Patient takes Eliquis for paroxysmal atrial fibrillation managed by Dr. Juliann Pares Patient smokes daily. Denies any alcohol use.   INTERVAL HISTORY Meagan Bridges is a 58 y.o. female who has above history reviewed by me today presents for follow up visit for  management of iron deficiency anemia. Patient was seen in February 2023 and lost follow-up Patient reports recently feeling very fatigued, lightheaded especially with position changes.  Denies any weight loss, easy bruising, hematemesis, hematochezia, hemoptysis or hematuria.  CT 02/04/2023.  Showed 2 suspicious nodules in the right lower lobe 02/25/2023 PET scan reviewed that these nodules have hypermetabolic activity.  Suspicious for malignancy. Patient was followed by pulmonology Dr. Meredeth Ide and was referred to [facility] oncology for evaluation.  Patient was seen by [facility] oncology on 04/08/2023.  There is plan of biopsy.  Review of Systems  Constitutional:  Positive for fatigue. Negative for appetite change, chills and fever.  HENT:   Negative for hearing loss and voice change.   Eyes:  Negative for eye problems.  Respiratory:  Negative for chest tightness and cough.   Cardiovascular:  Negative for chest pain.  Gastrointestinal:  Negative for abdominal distention, abdominal pain and blood in stool.  Endocrine: Negative for hot flashes.  Genitourinary:  Negative for difficulty urinating and frequency.   Musculoskeletal:  Negative for arthralgias.  Skin:  Negative for itching and rash.  Neurological:  Positive for light-headedness. Negative for extremity weakness.  Hematological:  Negative for adenopathy.  Psychiatric/Behavioral:  Negative for confusion.     MEDICAL HISTORY:  Past Medical History:  Diagnosis Date   Anxiety    Back pain    Chronic pain disorder    COPD (chronic obstructive pulmonary disease) (HCC)    Depression    Diabetes mellitus without complication (HCC)    Dysrhythmia    tachycardia   GERD (gastroesophageal reflux disease)    Heavy smoker    3-4ppd   IBS (irritable bowel syndrome)    Migraines     SURGICAL HISTORY: Past Surgical History:  Procedure Laterality Date   BACK SURGERY     CHOLECYSTECTOMY     COLONOSCOPY WITH PROPOFOL N/A 03/11/2016   Procedure:  COLONOSCOPY WITH PROPOFOL;  Surgeon: Scot Jun, MD;  Location: [facility] ENDOSCOPY;  Service: Endoscopy;  Laterality: N/A;   COLONOSCOPY WITH PROPOFOL N/A 01/26/2020   Procedure: COLONOSCOPY WITH PROPOFOL;  Surgeon: Toledo, Boykin Nearing, MD;  Location: [facility] ENDOSCOPY;  Service: Gastroenterology;  Laterality: N/A;   ESOPHAGOGASTRODUODENOSCOPY N/A 01/26/2020   Procedure: ESOPHAGOGASTRODUODENOSCOPY (EGD);  Surgeon: Toledo, Boykin Nearing, MD;  Location: [facility] ENDOSCOPY;  Service: Gastroenterology;  Laterality: N/A;   LUMBAR DISC SURGERY  2002   MICROLARYNGOSCOPY N/A 04/20/2018   Procedure: MICROLARYNGOSCOPY with BIOPSY;  Surgeon: Serena Colonel, MD;  Location: [facility];  Service: ENT;  Laterality: N/A;   UTERINE FIBROID SURGERY  2000    SOCIAL HISTORY: Social History  Socioeconomic History   Marital status: Married    Spouse name: Not on file   Number of children: Not on file   Years of education: Not on file   Highest education level: Not on file  Occupational History   Not on file  Tobacco Use   Smoking status: Every Day    Current packs/day: 2.00    Average packs/day: 2.0 packs/day for 40.0 years (80.0 ttl pk-yrs)    Types: Cigarettes   Smokeless tobacco: Never  Vaping Use   Vaping status: Never Used  Substance and Sexual Activity   Alcohol use: No    Alcohol/week: 0.0 standard drinks of alcohol   Drug use: No   Sexual activity: Yes    Birth control/protection: Post-menopausal  Other Topics Concern   Not on file  Social History Narrative   Not on file   Social Drivers of Health   Financial Resource Strain: Medium Risk (04/15/2023)   Received from [facility]   Overall Financial Resource Strain (CARDIA)    Difficulty of Paying Living Expenses: Somewhat hard  Food Insecurity: No Food Insecurity (04/15/2023)   Received from [facility]   Hunger Vital Sign    Worried About Running Out of Food in the Last Year: Never true    Ran  Out of Food in the Last Year: Never true  Transportation Needs: No Transportation Needs (04/15/2023)   Received from [facility] - Transportation    In the past 12 months, has lack of transportation kept you from medical appointments or from getting medications?: No    Lack of Transportation (Non-Medical): No  Physical Activity: Inactive (06/09/2018)   Received from [facility]   Exercise Vital Sign    Days of Exercise per Week: 0 days    Minutes of Exercise per Session: 0 min  Stress: Stress Concern Present (06/09/2018)   Received from [facility] - Occupational Stress Questionnaire    Feeling of Stress : Rather much  Social Connections: Unknown (06/09/2018)   Received from [facility]   Social Connection and Isolation Panel [NHANES]    Frequency of Communication with Friends and Family: Not on file    Frequency of Social Gatherings with Friends and Family: Not on file    Attends Religious Services: Not on file    Active Member of Clubs or Organizations: Not on file    Attends Banker Meetings: Not on file    Marital Status: Married  Catering manager Violence: Not on file    FAMILY HISTORY: Family History  Problem Relation Age of Onset   Diabetes Mother    Kidney disease Mother    Miscarriages / [place] Mother    Diabetes Father    Early death Father    Hearing loss Father    Heart disease Father    Hyperlipidemia Father    Hypertension Father    Learning disabilities Father    Breast cancer Neg Hx     ALLERGIES:  is allergic to latex, niacin, and tape.  MEDICATIONS:  Current Outpatient Medications  Medication Sig Dispense Refill   albuterol (PROVENTIL HFA;VENTOLIN HFA) 108 (90 Base) MCG/ACT inhaler Inhale into the lungs every 6 (six) hours as needed for wheezing or shortness of breath.     amitriptyline (ELAVIL) 25 MG tablet Take  100 mg by mouth at bedtime.  1   apixaban (ELIQUIS)  5 MG TABS tablet Take 1 tablet by mouth every 12 (twelve) hours.     Biotin 5000 MCG TABS Take 1 tablet by mouth daily.     buPROPion (WELLBUTRIN XL) 150 MG 24 hr tablet Take 150 mg by mouth daily.     busPIRone (BUSPAR) 15 MG tablet Take 30 mg by mouth 3 (three) times daily.      cyclobenzaprine (FLEXERIL) 10 MG tablet Limit 1 tablet by mouth 2 - 3 times per day if tolerated 90 tablet 0   diclofenac Sodium (VOLTAREN) 1 % GEL APPLY TWO TO FOUR GRAMS TO PAINFUL AREAS OF SKIN FOUR TIMES EACH DAY.     Dulaglutide 0.75 MG/0.5ML SOPN Inject 1.5 mg into the skin once a week.      DULoxetine (CYMBALTA) 60 MG capsule Take 60 mg by mouth 2 (two) times daily.     empagliflozin (JARDIANCE) 25 MG TABS tablet Take 25 mg by mouth daily.     esomeprazole (NEXIUM) 20 MG packet Take 20 mg by mouth daily before breakfast.     gabapentin (NEURONTIN) 100 MG capsule Take 100 mg by mouth 3 (three) times daily. 100 mg in the morning and at lunch. 200 mg at bedtime.     glipiZIDE (GLUCOTROL) 10 MG tablet Take 10 mg by mouth 2 (two) times daily before a meal.     HYDROcodone-acetaminophen (NORCO) 10-325 MG tablet Take 1 tablet by mouth every 6 (six) hours as needed for severe pain.     metFORMIN (GLUCOPHAGE) 500 MG tablet Take 1,000 mg by mouth 2 (two) times daily with a meal.      methylphenidate (RITALIN) 20 MG tablet Take 20 mg by mouth daily.     umeclidinium-vilanterol (ANORO ELLIPTA) 62.5-25 MCG/INH AEPB Inhale 1 puff into the lungs daily.     Cariprazine HCl 4.5 MG CAPS Take by mouth at bedtime.     cholecalciferol (VITAMIN D3) 25 MCG (1000 UT) tablet Take 1,000 Units by mouth daily. (Patient not taking: Reported on 04/21/2023)     diltiazem (DILACOR XR) 180 MG 24 hr capsule Take 120 mg by mouth daily.      fexofenadine (ALLEGRA) 180 MG tablet Take 180 mg by mouth daily. (Patient not taking: Reported on 04/21/2023)     fluticasone (FLONASE) 50 MCG/ACT nasal spray  Place into both nostrils daily. (Patient not taking: Reported on 04/21/2023)     magnesium gluconate (MAGONATE) 500 MG tablet Take by mouth. (Patient not taking: Reported on 04/21/2023)     omeprazole (PRILOSEC) 20 MG capsule Take 20 mg by mouth daily. (Patient not taking: Reported on 04/21/2023)     perphenazine (TRILAFON) 4 MG tablet Take 4 mg by mouth at bedtime.     tiZANidine (ZANAFLEX) 2 MG tablet LIMIT 1 TABLET BY MOUTH 2   4 TIMES PER DAY IF TOLERATED. NO CYCLOBENZAPRINE NO FLEXERIL     No current facility-administered medications for this visit.     PHYSICAL EXAMINATION: ECOG PERFORMANCE STATUS: 1 - Symptomatic but completely ambulatory Vitals:   04/21/23 1417  BP: (!) 130/56  Pulse: (!) 109  Resp: 18  Temp: (!) 97.4 F (36.3 C)  SpO2: 100%   Filed Weights   04/21/23 1417  Weight: 148 lb 8 oz (67.4 kg)    Physical Exam Constitutional:      General: She is not in acute distress. HENT:     Head: Normocephalic and atraumatic.  Eyes:     General: No scleral icterus. Cardiovascular:  Rate and Rhythm: Normal rate and regular rhythm.     Heart sounds: Normal heart sounds.  Pulmonary:     Effort: Pulmonary effort is normal. No respiratory distress.     Breath sounds: No wheezing.  Abdominal:     General: Bowel sounds are normal. There is no distension.     Palpations: Abdomen is soft.  Musculoskeletal:        General: No deformity. Normal range of motion.     Cervical back: Normal range of motion and neck supple.  Skin:    General: Skin is warm and dry.     Findings: No erythema or rash.  Neurological:     Mental Status: She is alert and oriented to person, place, and time. Mental status is at baseline.  Psychiatric:        Mood and Affect: Mood normal.      LABORATORY DATA:  I have reviewed the data as listed Lab Results  Component Value Date   WBC 14.0 (H) 04/21/2023   HGB 7.2 (L) 04/21/2023   HCT 26.0 (L) 04/21/2023   MCV 69.7 (L) 04/21/2023   PLT  464 (H) 04/21/2023   No results for input(s): "NA", "K", "CL", "CO2", "GLUCOSE", "BUN", "CREATININE", "CALCIUM", "GFRNONAA", "GFRAA", "PROT", "ALBUMIN", "AST", "ALT", "ALKPHOS", "BILITOT", "BILIDIR", "IBILI" in the last 8760 hours.  Iron/TIBC/Ferritin/ %Sat    Component Value Date/Time   IRON 13 (L) 04/21/2023 1455   TIBC 510 (H) 04/21/2023 1455   FERRITIN 4 (L) 04/21/2023 1455   IRONPCTSAT 3 (L) 04/21/2023 1455     RADIOGRAPHIC STUDIES: I have personally reviewed the radiological images as listed and agreed with the findings in the report. NM PET Image Initial (PI) Skull Base To Thigh (F-18 FDG) Result Date: 03/06/2023 CLINICAL DATA:  Initial treatment strategy for lung nodules. EXAM: NUCLEAR MEDICINE PET SKULL BASE TO THIGH TECHNIQUE: 8.9 mCi F-18 FDG was injected intravenously. Full-ring PET imaging was performed from the skull base to thigh after the radiotracer. CT data was obtained and used for attenuation correction and anatomic localization. Fasting blood glucose: 96 mg/dl COMPARISON:  95/28/4132 FINDINGS: Mediastinal blood pool activity: SUV max 2.5 Liver activity: SUV max NA NECK: No significant abnormal hypermetabolic activity in this region. Incidental CT findings: None. CHEST: The more lateral nodule in the superior segment right lower lobe has a maximum SUV of 6.8 and measures about 0.9 by 0.8 cm on image 50 series 2. The more medial nodule currently measures about 5 mm in diameter on image 51 of series 2, maximum SUV of 1.3, although this lesion is below sensitive PET-CT size thresholds. Essential weighted activity in the distal half of the thoracic esophagus, maximum SUV 6.3, probably physiologic but technically nonspecific. No abnormal activity associated with the scar-like density in the lingula. Incidental CT findings: Mild biapical pleuroparenchymal scarring. Emphysema. Mild atheromatous vascular calcification of the thoracic aorta. ABDOMEN/PELVIS: Physiologic activity in bowel.  Incidental CT findings: Cholecystectomy. Atherosclerosis is present, including aortoiliac atherosclerotic disease. Possible pelvic floor laxity. SKELETON: No significant abnormal hypermetabolic activity in this region. Incidental CT findings: None. IMPRESSION: 1. The more lateral nodule in the superior segment right lower lobe has a maximum SUV of 6.8 and measures about 0.9 by 0.8 cm. This is suspicious for primary bronchogenic carcinoma. 2. The more medial nodule in the superior segment right lower lobe currently measures about 5 mm in diameter, maximum SUV of only 1.3, although this lesion is below sensitive PET-CT size thresholds. 3. No findings of metastatic disease.  4. Emphysema. 5. Aortic atherosclerosis. 6. Possible pelvic floor laxity. Aortic Atherosclerosis (ICD10-I70.0) and Emphysema (ICD10-J43.9). Electronically Signed   By: Gaylyn Rong M.D.   On: 03/06/2023 12:56   CT CHEST WO CONTRAST Result Date: 02/18/2023 CLINICAL DATA:  Pulmonary nodule. Smoker. Cough. Known COPD. * Tracking Code: BO * EXAM: CT CHEST WITHOUT CONTRAST TECHNIQUE: Multidetector CT imaging of the chest was performed following the standard protocol without IV contrast. RADIATION DOSE REDUCTION: This exam was performed according to the departmental dose-optimization program which includes automated exposure control, adjustment of the mA and/or kV according to patient size and/or use of iterative reconstruction technique. COMPARISON:  No recent imaging to evaluate for nodule stability. 10/26/2019 abdominal CT and 10/12/2015 chest radiograph reviewed. FINDINGS: Cardiovascular: Aortic atherosclerosis. Normal heart size, without pericardial effusion. Mediastinum/Nodes: No mediastinal or hilar adenopathy, given limitations of unenhanced CT. Lungs/Pleura: No pleural fluid. Biapical pleuroparenchymal scarring. Moderate centrilobular emphysema. 2 adjacent superior segment right lower lobe pulmonary nodules. The larger, more lateral  measures 1.0 x 1.0 cm on 51/4. The smaller, more medial measures 6 x 8 mm on 52/4. Posterolateral lingular scarring. Upper Abdomen: Cholecystectomy. Normal imaged portions of the liver, spleen, stomach, pancreas, right adrenal gland, kidneys. Mild left adrenal thickening. Abdominal aortic atherosclerosis. Musculoskeletal: Lower cervical spondylosis. IMPRESSION: 1. Superior segment right lower lobe pulmonary nodules x2, the largest measuring 1.0 cm. Cannot exclude primary bronchogenic carcinoma or carcinomas. Recommend multidisciplinary thoracic oncology consultation for eventual PET. 2. No prior imaging or reports available to evaluate for stability or enlargement. 3. No thoracic adenopathy. 4. Aortic atherosclerosis (ICD10-I70.0) and emphysema (ICD10-J43.9). Electronically Signed   By: Jeronimo Greaves M.D.   On: 02/18/2023 08:39

## 2023-04-22 ENCOUNTER — Telehealth: Payer: Self-pay

## 2023-04-22 NOTE — Telephone Encounter (Signed)
 Spoke to pt and informed her of MD recommendation.   Please schedule and inform pt of appt details:   IV venofer weekly x4 (first dose this week) 2 months: labs prior to MD/ Venofer

## 2023-04-22 NOTE — Telephone Encounter (Signed)
-----   Message from Rickard Patience sent at 04/21/2023  9:22 PM EDT ----- Patient has severe iron deficiency anemia.  Please arrange patient to get IV Venofer weekly x 4.  First dose this week ASAP Follow-up appointment 2 months, lab prior to MD +/- Venofer.  Labs are ordered.  Thank you

## 2023-04-24 ENCOUNTER — Inpatient Hospital Stay

## 2023-04-24 VITALS — BP 119/58 | HR 95 | Temp 97.6°F | Resp 18

## 2023-04-24 DIAGNOSIS — D509 Iron deficiency anemia, unspecified: Secondary | ICD-10-CM | POA: Diagnosis not present

## 2023-04-24 MED ORDER — IRON SUCROSE 20 MG/ML IV SOLN
200.0000 mg | Freq: Once | INTRAVENOUS | Status: AC
  Administered 2023-04-24: 200 mg via INTRAVENOUS
  Filled 2023-04-24: qty 10

## 2023-04-24 NOTE — Patient Instructions (Signed)

## 2023-05-01 ENCOUNTER — Inpatient Hospital Stay

## 2023-05-08 ENCOUNTER — Inpatient Hospital Stay

## 2023-05-15 ENCOUNTER — Inpatient Hospital Stay: Attending: Oncology

## 2023-05-15 VITALS — BP 102/69 | HR 104 | Temp 99.0°F | Resp 16

## 2023-05-15 DIAGNOSIS — D509 Iron deficiency anemia, unspecified: Secondary | ICD-10-CM | POA: Insufficient documentation

## 2023-05-15 MED ORDER — IRON SUCROSE 20 MG/ML IV SOLN
200.0000 mg | Freq: Once | INTRAVENOUS | Status: AC
Start: 2023-05-15 — End: 2023-05-15
  Administered 2023-05-15: 200 mg via INTRAVENOUS
  Filled 2023-05-15: qty 10

## 2023-05-15 NOTE — Patient Instructions (Signed)

## 2023-05-22 ENCOUNTER — Inpatient Hospital Stay

## 2023-05-22 VITALS — BP 119/70 | HR 92 | Temp 96.8°F | Resp 16

## 2023-05-22 DIAGNOSIS — D509 Iron deficiency anemia, unspecified: Secondary | ICD-10-CM | POA: Diagnosis not present

## 2023-05-22 MED ORDER — IRON SUCROSE 20 MG/ML IV SOLN
200.0000 mg | Freq: Once | INTRAVENOUS | Status: AC
  Administered 2023-05-22: 200 mg via INTRAVENOUS
  Filled 2023-05-22: qty 10

## 2023-05-22 NOTE — Patient Instructions (Signed)

## 2023-05-23 ENCOUNTER — Telehealth: Payer: Self-pay | Admitting: Oncology

## 2023-05-23 NOTE — Telephone Encounter (Signed)
 Pt called to cancel May 2025 appts as she is moving to [place] to live with her daughter.   Appts canceled and noted

## 2023-05-26 ENCOUNTER — Inpatient Hospital Stay

## 2023-05-26 VITALS — BP 126/68 | HR 94 | Temp 96.3°F | Resp 16

## 2023-05-26 DIAGNOSIS — D509 Iron deficiency anemia, unspecified: Secondary | ICD-10-CM | POA: Diagnosis not present

## 2023-05-26 MED ORDER — IRON SUCROSE 20 MG/ML IV SOLN
200.0000 mg | Freq: Once | INTRAVENOUS | Status: AC
  Administered 2023-05-26: 200 mg via INTRAVENOUS
  Filled 2023-05-26: qty 10

## 2023-05-26 NOTE — Progress Notes (Signed)
 Patient tolerated Venofer injection today. Patient declined staying for her 30 minute observation period. Educated patient to go to ED if she has any symptoms of allergic reaction. She verbalized understanding. Vitals stable at discharge.

## 2023-05-29 ENCOUNTER — Inpatient Hospital Stay

## 2023-06-23 ENCOUNTER — Other Ambulatory Visit

## 2023-06-26 ENCOUNTER — Ambulatory Visit: Admitting: Oncology

## 2023-06-26 ENCOUNTER — Ambulatory Visit

## 2023-08-04 ENCOUNTER — Encounter: Payer: Self-pay | Admitting: Oncology

## 2023-12-12 ENCOUNTER — Encounter: Payer: Self-pay | Admitting: Oncology

## 2024-01-27 ENCOUNTER — Other Ambulatory Visit: Payer: Self-pay

## 2024-01-27 ENCOUNTER — Telehealth: Payer: Self-pay | Admitting: Oncology

## 2024-01-27 DIAGNOSIS — R42 Dizziness and giddiness: Secondary | ICD-10-CM

## 2024-01-27 DIAGNOSIS — E538 Deficiency of other specified B group vitamins: Secondary | ICD-10-CM

## 2024-01-27 DIAGNOSIS — D508 Other iron deficiency anemias: Secondary | ICD-10-CM

## 2024-01-27 NOTE — Telephone Encounter (Signed)
 Pt calling and states she will be back in [place]  in January and wants to make an appt with Dr. BABARA for her cancer surgery and to schedule more iron  infusions. Please advise. [phone]

## 2024-01-27 NOTE — Telephone Encounter (Signed)
 Ok to schedule appts: labs prior to MD/ Venofer 

## 2024-03-04 IMAGING — MG MM DIGITAL SCREENING BILAT W/ TOMO AND CAD
6 of 10 series · 6 of 30 positions shown · non-contrast
Comparison: Previous exam(s).

CLINICAL DATA: Screening.

EXAM:
DIGITAL SCREENING BILATERAL MAMMOGRAM WITH TOMOSYNTHESIS AND CAD
TECHNIQUE: Bilateral screening digital craniocaudal and mediolateral oblique
mammograms were obtained. Bilateral screening digital breast
tomosynthesis was performed. The images were evaluated with
computer-aided detection.

[R MLO synth-2D (1 of 2)]
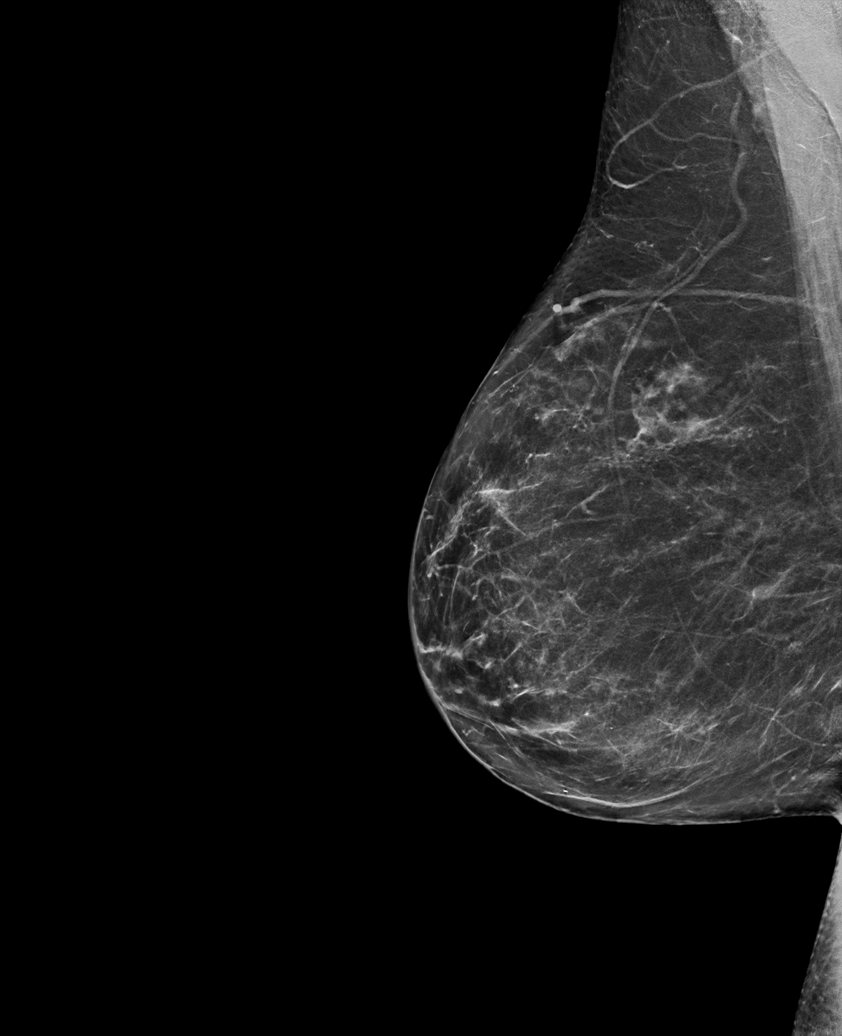

[L MLO synth-2D]
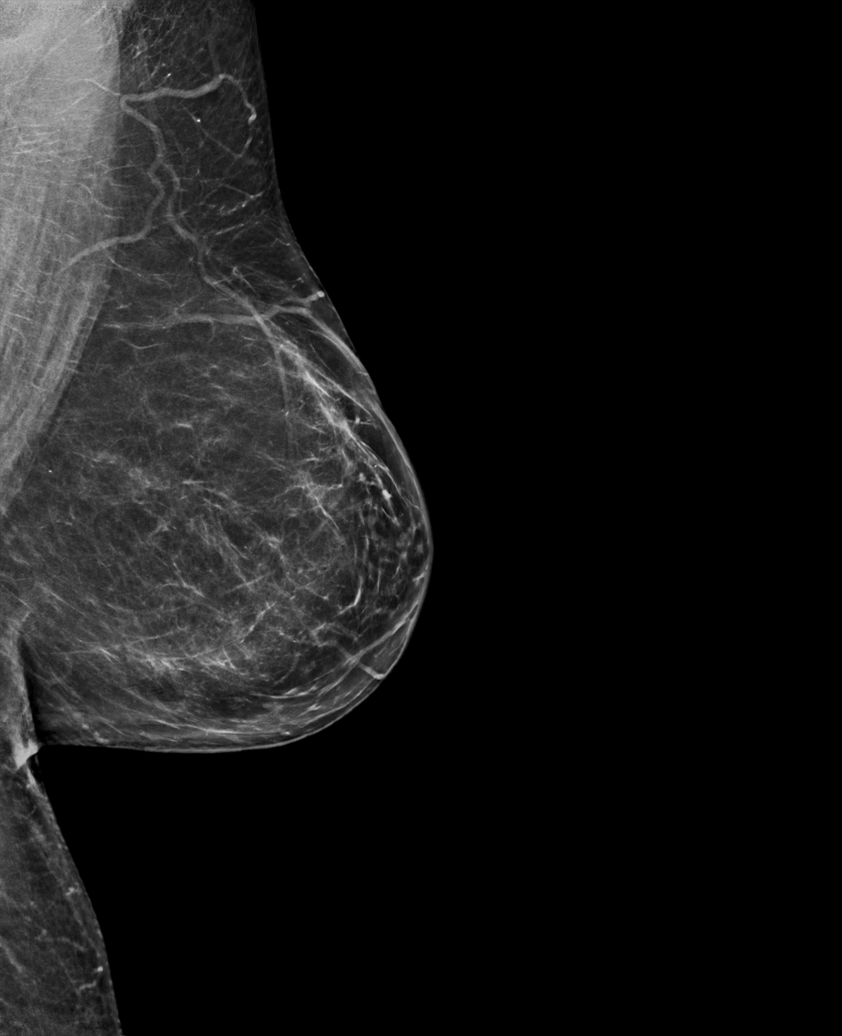

[R CC synth-2D]
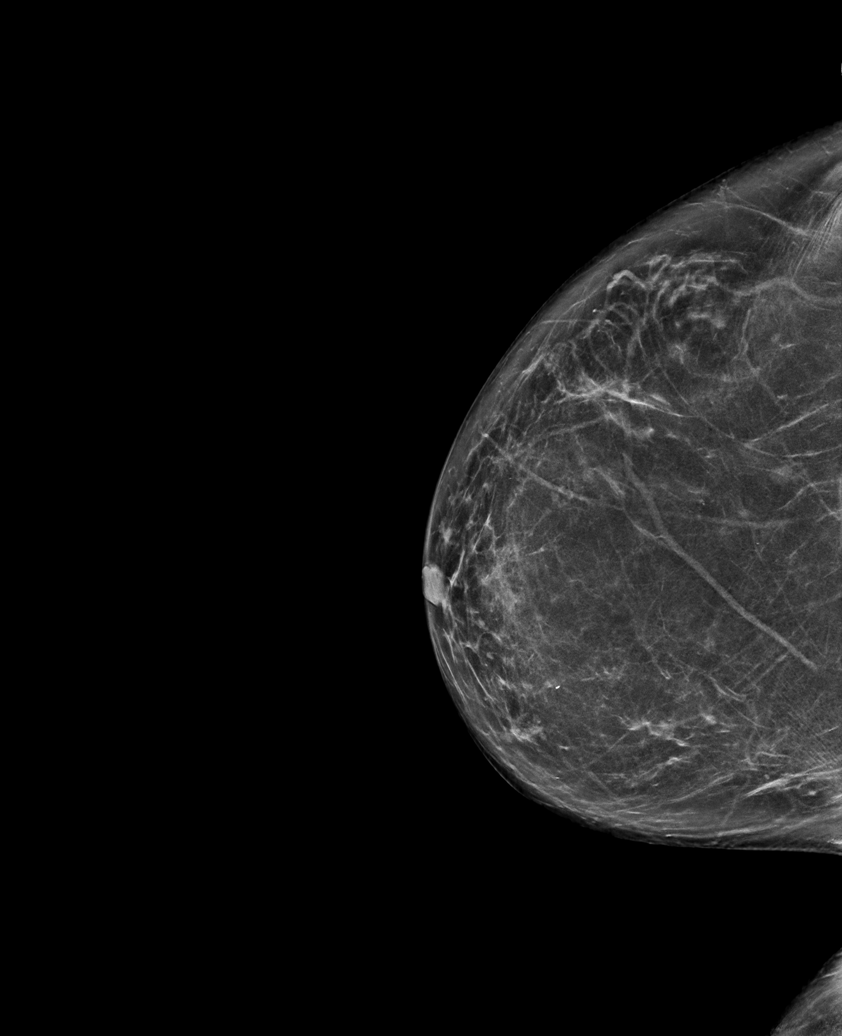

[R MLO synth-2D (2 of 2)]
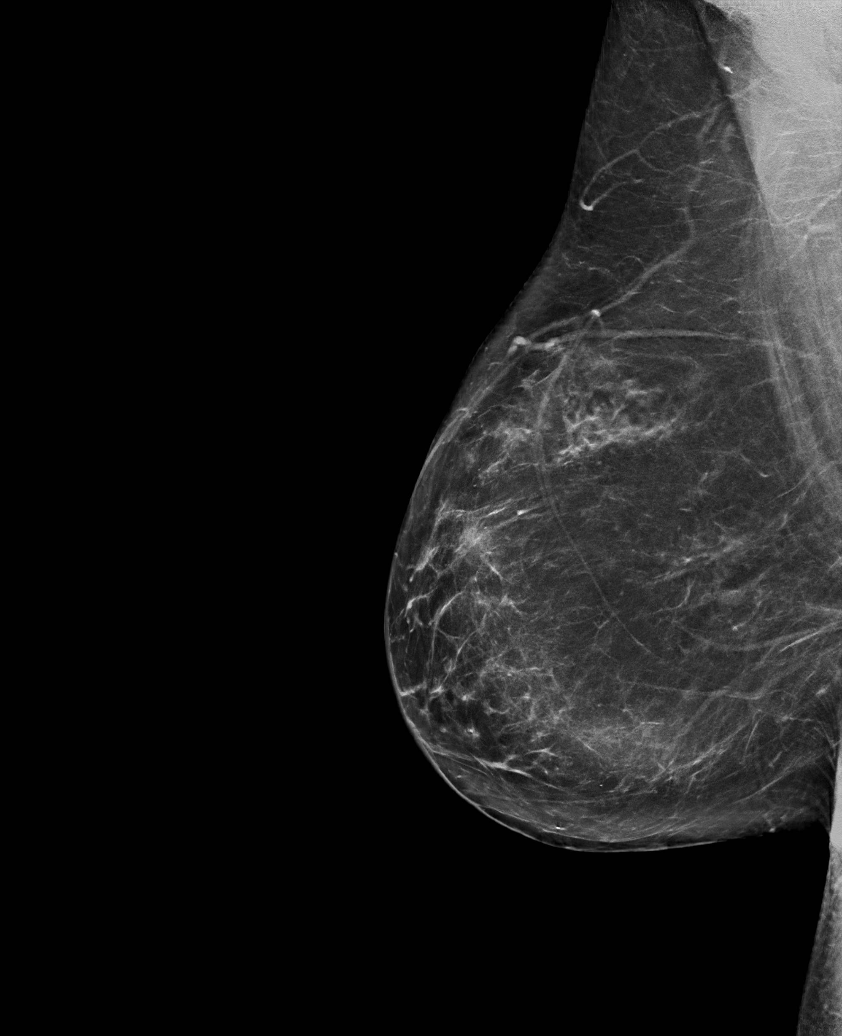

[L CC synth-2D]
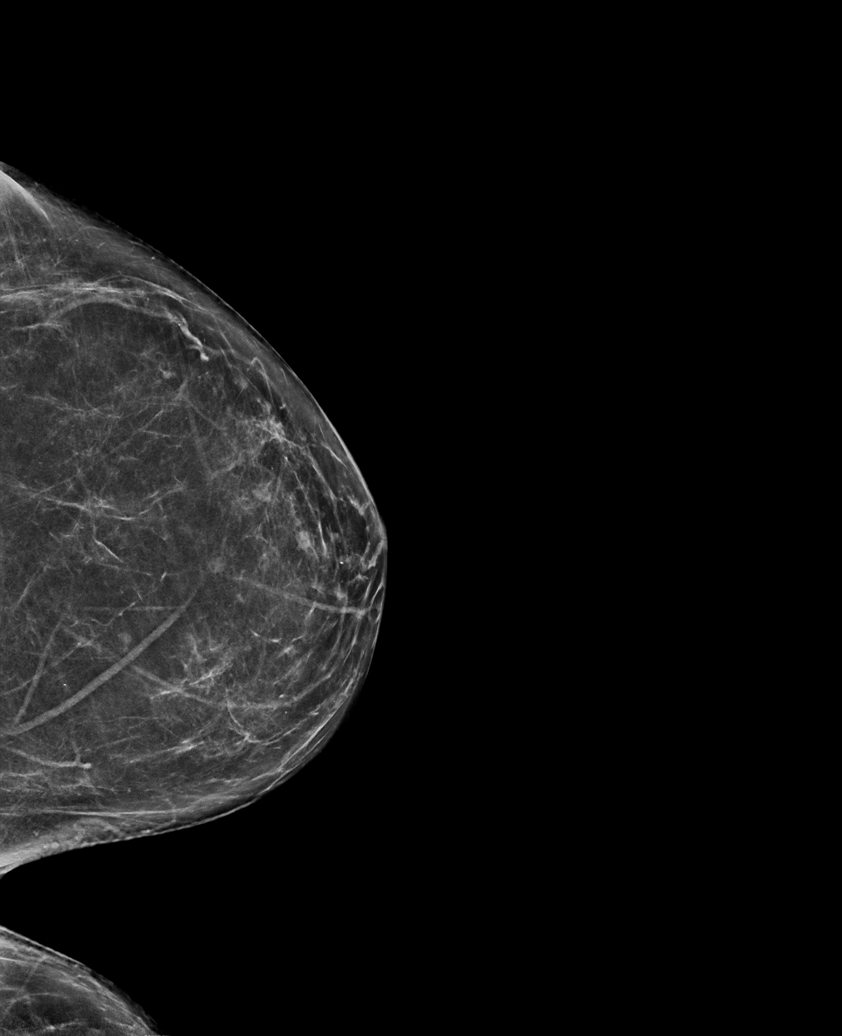

[R MLO tomo · tomo slice 35/70.0]
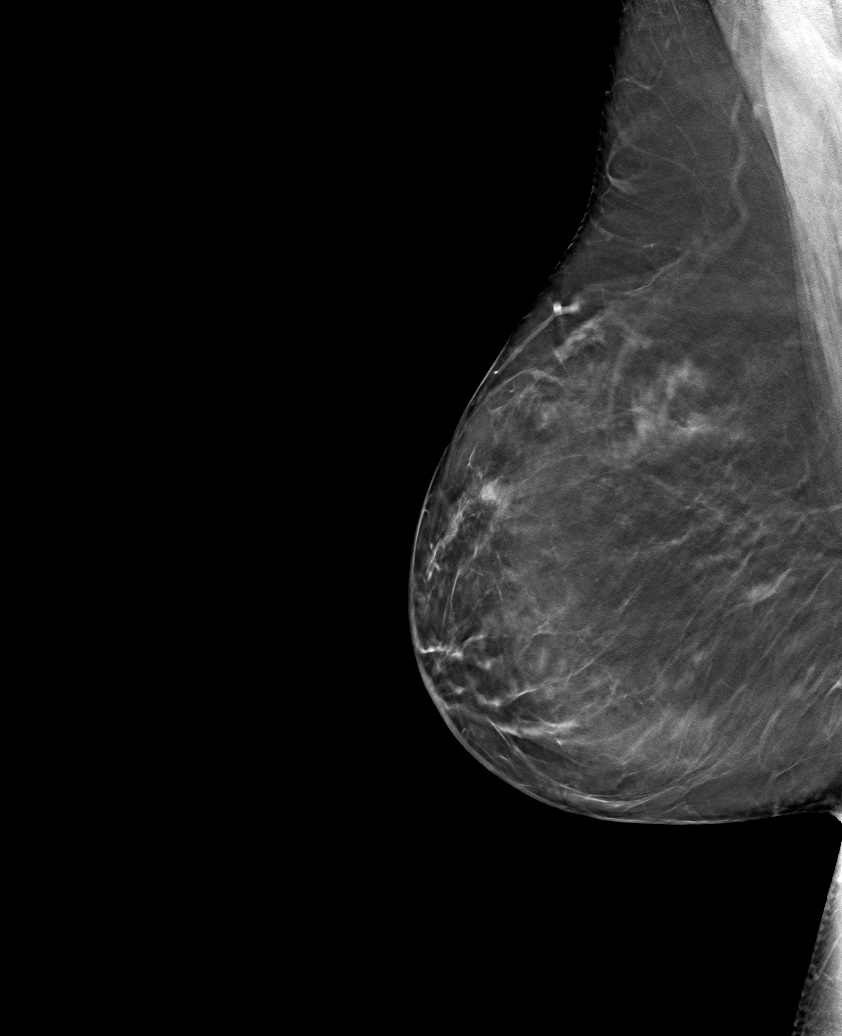

[6 of 30 positions shown; findings below may reference images not displayed]

ACR Breast Density Category b: There are scattered areas of
fibroglandular density.
FINDINGS: There are no findings suspicious for malignancy.
IMPRESSION: No mammographic evidence of malignancy. A result letter of this
screening mammogram will be mailed directly to the patient.

RECOMMENDATION:
Screening mammogram in one year. (Code:51-O-LD2)

BI-RADS CATEGORY  1: Negative.

## 2024-03-12 ENCOUNTER — Telehealth: Payer: Self-pay | Admitting: Oncology

## 2024-03-12 NOTE — Telephone Encounter (Signed)
 Received call from Dr. Tyna office asking if we had seen patient for her lung cancer dx. We did receive a referral to Rad Onc from [facility], but no updated communication from [place]. He does have a progress note from 02/16/2024 that stated he wanted a referral to be sent, but it was never forwarded. The patient is already established. Please advise on scheduling due to updated diagnosis.

## 2024-03-12 NOTE — Telephone Encounter (Signed)
 Please move up appt to next week, labs at least 1 day prior.

## 2024-03-12 NOTE — Telephone Encounter (Signed)
 Pt called to r/s lab from 2/2 - pt confirmed new appt date/time - LH

## 2024-03-15 ENCOUNTER — Inpatient Hospital Stay

## 2024-03-17 ENCOUNTER — Telehealth: Payer: Self-pay | Admitting: Oncology

## 2024-03-17 NOTE — Telephone Encounter (Signed)
 Pt called to r/s lab, MD, ven due to weather - ice and snow in driveway - r/s appts w/pt (day 1 / day 2) - LH

## 2024-03-18 ENCOUNTER — Inpatient Hospital Stay

## 2024-03-22 ENCOUNTER — Inpatient Hospital Stay: Admitting: Oncology

## 2024-03-22 ENCOUNTER — Ambulatory Visit: Admitting: Radiation Oncology

## 2024-03-22 ENCOUNTER — Inpatient Hospital Stay

## 2024-03-24 ENCOUNTER — Ambulatory Visit: Admitting: Radiation Oncology

## 2024-04-16 ENCOUNTER — Inpatient Hospital Stay

## 2024-04-20 ENCOUNTER — Inpatient Hospital Stay: Admitting: Oncology

## 2024-04-20 ENCOUNTER — Inpatient Hospital Stay
